# Patient Record
Sex: Female | Born: 1955 | Race: Asian | Hispanic: No | Marital: Married | Smoking: Never smoker
Health system: Southern US, Community
[De-identification: ages and names within clinical notes are randomized; demographics above are authoritative.]

## PROBLEM LIST (undated history)

## (undated) DIAGNOSIS — F419 Anxiety disorder, unspecified: Secondary | ICD-10-CM

## (undated) HISTORY — PX: JOINT REPLACEMENT: SHX530

## (undated) HISTORY — PX: CARPAL TUNNEL RELEASE: SHX101

## (undated) HISTORY — PX: APPENDECTOMY: SHX54

---

## 1998-03-17 ENCOUNTER — Ambulatory Visit (HOSPITAL_COMMUNITY): Admission: RE | Admit: 1998-03-17 | Discharge: 1998-03-17 | Payer: Self-pay | Admitting: *Deleted

## 2000-07-14 ENCOUNTER — Inpatient Hospital Stay (HOSPITAL_COMMUNITY): Admission: AD | Admit: 2000-07-14 | Discharge: 2000-07-14 | Payer: Self-pay | Admitting: Obstetrics and Gynecology

## 2000-07-14 ENCOUNTER — Encounter: Payer: Self-pay | Admitting: Emergency Medicine

## 2000-09-19 ENCOUNTER — Other Ambulatory Visit: Admission: RE | Admit: 2000-09-19 | Discharge: 2000-09-19 | Payer: Self-pay | Admitting: Obstetrics and Gynecology

## 2002-04-23 ENCOUNTER — Other Ambulatory Visit: Admission: RE | Admit: 2002-04-23 | Discharge: 2002-04-23 | Payer: Self-pay | Admitting: Obstetrics and Gynecology

## 2003-04-29 ENCOUNTER — Other Ambulatory Visit: Admission: RE | Admit: 2003-04-29 | Discharge: 2003-04-29 | Payer: Self-pay | Admitting: Obstetrics and Gynecology

## 2004-10-04 ENCOUNTER — Emergency Department (HOSPITAL_COMMUNITY): Admission: EM | Admit: 2004-10-04 | Discharge: 2004-10-04 | Payer: Self-pay

## 2004-10-23 ENCOUNTER — Other Ambulatory Visit: Admission: RE | Admit: 2004-10-23 | Discharge: 2004-10-23 | Payer: Self-pay | Admitting: Obstetrics and Gynecology

## 2010-11-28 ENCOUNTER — Emergency Department (HOSPITAL_BASED_OUTPATIENT_CLINIC_OR_DEPARTMENT_OTHER)
Admission: EM | Admit: 2010-11-28 | Discharge: 2010-11-29 | Disposition: A | Discharge status: Home / Self Care | Payer: BC Managed Care – PPO | Attending: Emergency Medicine | Admitting: Emergency Medicine

## 2010-11-28 ENCOUNTER — Encounter: Payer: Self-pay | Admitting: *Deleted

## 2010-11-28 DIAGNOSIS — R1013 Epigastric pain: Secondary | ICD-10-CM | POA: Insufficient documentation

## 2010-11-28 DIAGNOSIS — R079 Chest pain, unspecified: Secondary | ICD-10-CM | POA: Insufficient documentation

## 2010-11-28 DIAGNOSIS — Z79899 Other long term (current) drug therapy: Secondary | ICD-10-CM | POA: Insufficient documentation

## 2010-11-28 DIAGNOSIS — F410 Panic disorder [episodic paroxysmal anxiety] without agoraphobia: Secondary | ICD-10-CM | POA: Insufficient documentation

## 2010-11-28 HISTORY — DX: Anxiety disorder, unspecified: F41.9

## 2010-11-28 MED ORDER — GI COCKTAIL ~~LOC~~
30.0000 mL | Freq: Once | ORAL | Status: AC
  Administered 2010-11-29: 30 mL via ORAL
  Filled 2010-11-28: qty 30

## 2010-11-28 MED ORDER — ACETAMINOPHEN 325 MG PO TABS
650.0000 mg | ORAL_TABLET | Freq: Once | ORAL | Status: AC
  Administered 2010-11-29: 650 mg via ORAL
  Filled 2010-11-28: qty 2

## 2010-11-28 NOTE — ED Notes (Signed)
Pt is anxious and is hyperventilating slightly. Pt encouraged to slow breathing down.

## 2010-11-28 NOTE — ED Notes (Signed)
Pt states " panic attack" during presentation today. HX anxiety

## 2010-11-29 ENCOUNTER — Emergency Department (INDEPENDENT_AMBULATORY_CARE_PROVIDER_SITE_OTHER): Payer: BC Managed Care – PPO

## 2010-11-29 ENCOUNTER — Other Ambulatory Visit: Payer: Self-pay

## 2010-11-29 DIAGNOSIS — R0602 Shortness of breath: Secondary | ICD-10-CM

## 2010-11-29 DIAGNOSIS — F410 Panic disorder [episodic paroxysmal anxiety] without agoraphobia: Secondary | ICD-10-CM

## 2010-11-29 DIAGNOSIS — M4120 Other idiopathic scoliosis, site unspecified: Secondary | ICD-10-CM

## 2010-11-29 LAB — COMPREHENSIVE METABOLIC PANEL
AST: 23 U/L (ref 0–37)
Albumin: 4.3 g/dL (ref 3.5–5.2)
Alkaline Phosphatase: 69 U/L (ref 39–117)
BUN: 12 mg/dL (ref 6–23)
Creatinine, Ser: 0.6 mg/dL (ref 0.50–1.10)
Potassium: 3.3 mEq/L — ABNORMAL LOW (ref 3.5–5.1)
Total Protein: 8 g/dL (ref 6.0–8.3)

## 2010-11-29 LAB — TROPONIN I: Troponin I: <0.3 ng/mL (ref <0.30)

## 2010-11-29 LAB — LIPASE, BLOOD: Lipase: 76 U/L — ABNORMAL HIGH (ref 11–59)

## 2010-11-29 LAB — DIFFERENTIAL
Basophils Absolute: 0 10*3/uL (ref 0.0–0.1)
Basophils Relative: 0 % (ref 0–1)
Eosinophils Absolute: 0.1 10*3/uL (ref 0.0–0.7)
Monocytes Relative: 8 % (ref 3–12)
Neutrophils Relative %: 78 % — ABNORMAL HIGH (ref 43–77)

## 2010-11-29 LAB — CBC
Hemoglobin: 15.6 g/dL — ABNORMAL HIGH (ref 12.0–15.0)
MCH: 33.9 pg (ref 26.0–34.0)
MCHC: 35 g/dL (ref 30.0–36.0)
RDW: 11.4 % — ABNORMAL LOW (ref 11.5–15.5)

## 2010-11-29 LAB — CK TOTAL AND CKMB (NOT AT ARMC)
CK, MB: 3.2 ng/mL (ref 0.3–4.0)
CK, MB: 3.5 ng/mL (ref 0.3–4.0)
Relative Index: 2.2 (ref 0.0–2.5)
Total CK: 134 U/L (ref 7–177)
Total CK: 157 U/L (ref 7–177)

## 2010-11-29 LAB — TSH: TSH: 3.721 u[IU]/mL (ref 0.350–4.500)

## 2010-11-29 LAB — T4, FREE: Free T4: 1.16 ng/dL (ref 0.80–1.80)

## 2010-11-29 MED ORDER — IBUPROFEN 800 MG PO TABS
800.0000 mg | ORAL_TABLET | Freq: Once | ORAL | Status: AC
  Administered 2010-11-29: 800 mg via ORAL
  Filled 2010-11-29: qty 1

## 2010-11-29 NOTE — ED Provider Notes (Signed)
History    this patient presents 4 hours after the sudden onset of epigastric pain.  The patient notes that soon after the pain began she was nauseous, lightheaded, and her symptoms persisted for some time, using spontaneously. The pain was epigastric with minimal radiation superiorly about the sternum. No pleuritic pain, no exertional pain, no diarrhea. Notably, the patient does have a history of depression, anxiety. She notes "many" anxiety attacks, each of which manifests characteristically the same. These attacks are marked by palpitations, not pain, in the absence of nausea or headache. The patient has one similar episode of pain, which occurred almost exactly one year ago after similar circumstances (a stressful work situation). On MD eval, the patient only complains of headache.  CSN: [id number] Arrival date & time: 11/28/2010 10:37 PM  Chief Complaint  Patient presents with  . Panic Attack    HPI  (Consider location/radiation/quality/duration/timing/severity/associated sxs/prior treatment)  HPI  Past Medical History  Diagnosis Date  . Anxiety     Past Surgical History  Procedure Date  . Carpal tunnel release   . Joint replacement   . Appendectomy     History reviewed. No pertinent family history.  History  Substance Use Topics  . Smoking status: Never Smoker   . Smokeless tobacco: Not on file  . Alcohol Use: 0.6 oz/week    1 Glasses of wine per week    OB History    Grav Para Term Preterm Abortions TAB SAB Ect Mult Living                  Review of Systems  Review of Systems Gen: Per HPI HEENT: + HA CV: No CP Resp: No dyspnea Abd: Per HPI, otherwise negative Musk: Per HPI, otherwise negative Neuro: No dysesthesia, or focal changes GU: Per HPI, otherwise negative Skin: Neg Psych: Neg  Allergies  Review of patient's allergies indicates not on file.  Home Medications   Current Outpatient Rx  Name Route Sig Dispense Refill  . ALPRAZOLAM 0.5 MG PO  TABS Oral Take 0.5 mg by mouth at bedtime.      Marland Kitchen VITAMIN D 1000 UNITS PO TABS Oral Take 1,000 Units by mouth daily.      . OMEGA-3 FATTY ACIDS 1000 MG PO CAPS Oral Take 1 g by mouth daily.      Marland Kitchen FLUOXETINE HCL 20 MG PO CAPS Oral Take 60 mg by mouth daily.      Marland Kitchen OVER THE COUNTER MEDICATION Oral Take 10 mLs by mouth daily. DHEA     . ZOLPIDEM TARTRATE 10 MG PO TABS Oral Take 10 mg by mouth at bedtime.        Physical Exam    BP 150/100  Pulse 72  Temp(Src) 98.3 F (36.8 C) (Oral)  Resp 16  Ht 5\' 2"  (1.575 m)  Wt 130 lb (58.968 kg)  BMI 23.78 kg/m2  SpO2 100%  Physical Exam  Constitutional: She is oriented to person, place, and time. She appears well-developed and well-nourished.  HENT:  Head: Normocephalic and atraumatic.  Eyes: EOM are normal.  Cardiovascular: Normal rate and regular rhythm.   Pulmonary/Chest: Effort normal and breath sounds normal.  Abdominal: She exhibits no distension.  Musculoskeletal: She exhibits no edema and no tenderness.  Neurological: She is alert and oriented to person, place, and time.  Skin: Skin is warm and dry.    ED Course  Procedures (including critical care time)  Labs Reviewed  CBC - Abnormal; Notable for the following:  Hemoglobin 15.6 (*)    RDW 11.4 (*)    All other components within normal limits  DIFFERENTIAL - Abnormal; Notable for the following:    Neutrophils Relative 78 (*)    All other components within normal limits  TROPONIN I  TSH  CK TOTAL AND CKMB  COMPREHENSIVE METABOLIC PANEL  LIPASE, BLOOD  T4, FREE   No results found.   No diagnosis found.   MDM This 55 year old female with history of anxiety, and depression now presents with epigastric pain, nausea, headache. The symptoms are different from her prior anxiety attacks, and the patient has no history of prior cardiac evaluation. However the similarity with one prior event, the absence of distress, and the absence of current complaints his reassuring for  low probability of a CNS. The patient will be evaluated, and if she remains well him with a negative evaluation she'll be discharged to follow up with a cardiologist for further evaluation.  Labs Reviewed  CBC - Abnormal; Notable for the following:    Hemoglobin 15.6 (*)    RDW 11.4 (*)    All other components within normal limits  DIFFERENTIAL - Abnormal; Notable for the following:    Neutrophils Relative 78 (*)    All other components within normal limits  COMPREHENSIVE METABOLIC PANEL - Abnormal; Notable for the following:    Potassium 3.3 (*)    Glucose, Bld 118 (*)    All other components within normal limits  LIPASE, BLOOD - Abnormal; Notable for the following:    Lipase 76 (*)    All other components within normal limits  TROPONIN I  CK TOTAL AND CKMB  TROPONIN I  CK TOTAL AND CKMB  TSH  T4, FREE   CXR: neg for acute findings   Date: 11/29/2010  Rate: 58  Rhythm: sinus bradycardia  QRS Axis: normal  Intervals: QT prolonged  ST/T Wave abnormalities: normal  Conduction Disutrbances:none  Narrative Interpretation:   Old EKG Reviewed: none available  Patient has remained essentially well in the ED while awaiting a second set of cardiac enzymes. Repeat values were similarly reassuring/unremarkable. With the absence of evidence for acute cardiac ischemia, and the patient's history of anxiety, she will be discharged to follow up with her primary care physician.         Gerhard Munch, MD 11/29/10 [phone]

## 2010-12-04 ENCOUNTER — Ambulatory Visit
Admission: RE | Admit: 2010-12-04 | Discharge: 2010-12-04 | Disposition: A | Discharge status: Home / Self Care | Payer: BC Managed Care – PPO | Source: Ambulatory Visit | Attending: Family Medicine | Admitting: Family Medicine

## 2010-12-04 ENCOUNTER — Other Ambulatory Visit: Payer: Self-pay | Admitting: Family Medicine

## 2010-12-04 DIAGNOSIS — R51 Headache: Secondary | ICD-10-CM

## 2010-12-04 DIAGNOSIS — R1110 Vomiting, unspecified: Secondary | ICD-10-CM

## 2011-08-20 ENCOUNTER — Emergency Department (HOSPITAL_COMMUNITY): Payer: BC Managed Care – PPO

## 2011-08-20 ENCOUNTER — Encounter (HOSPITAL_COMMUNITY): Payer: Self-pay | Admitting: *Deleted

## 2011-08-20 ENCOUNTER — Emergency Department (HOSPITAL_COMMUNITY)
Admission: EM | Admit: 2011-08-20 | Discharge: 2011-08-20 | Disposition: A | Discharge status: Home / Self Care | Payer: BC Managed Care – PPO | Attending: Emergency Medicine | Admitting: Emergency Medicine

## 2011-08-20 DIAGNOSIS — W19XXXA Unspecified fall, initial encounter: Secondary | ICD-10-CM

## 2011-08-20 DIAGNOSIS — S060XAA Concussion with loss of consciousness status unknown, initial encounter: Secondary | ICD-10-CM | POA: Insufficient documentation

## 2011-08-20 DIAGNOSIS — R209 Unspecified disturbances of skin sensation: Secondary | ICD-10-CM | POA: Insufficient documentation

## 2011-08-20 DIAGNOSIS — W010XXA Fall on same level from slipping, tripping and stumbling without subsequent striking against object, initial encounter: Secondary | ICD-10-CM | POA: Insufficient documentation

## 2011-08-20 DIAGNOSIS — I609 Nontraumatic subarachnoid hemorrhage, unspecified: Secondary | ICD-10-CM | POA: Insufficient documentation

## 2011-08-20 DIAGNOSIS — M542 Cervicalgia: Secondary | ICD-10-CM | POA: Insufficient documentation

## 2011-08-20 DIAGNOSIS — S060X9A Concussion with loss of consciousness of unspecified duration, initial encounter: Secondary | ICD-10-CM | POA: Insufficient documentation

## 2011-08-20 LAB — PROTIME-INR: Prothrombin Time: 12.7 seconds (ref 11.6–15.2)

## 2011-08-20 LAB — COMPREHENSIVE METABOLIC PANEL
ALT: 13 U/L (ref 0–35)
Albumin: 3.7 g/dL (ref 3.5–5.2)
Alkaline Phosphatase: 62 U/L (ref 39–117)
Potassium: 3.6 mEq/L (ref 3.5–5.1)
Sodium: 140 mEq/L (ref 135–145)
Total Protein: 7.5 g/dL (ref 6.0–8.3)

## 2011-08-20 LAB — DIFFERENTIAL
Basophils Absolute: 0 10*3/uL (ref 0.0–0.1)
Basophils Relative: 0 % (ref 0–1)
Eosinophils Absolute: 0 10*3/uL (ref 0.0–0.7)
Neutrophils Relative %: 64 % (ref 43–77)

## 2011-08-20 LAB — CBC
MCH: 33.6 pg (ref 26.0–34.0)
MCHC: 33.9 g/dL (ref 30.0–36.0)
Platelets: 221 10*3/uL (ref 150–400)

## 2011-08-20 NOTE — ED Provider Notes (Signed)
I saw and evaluated the patient, reviewed the resident's note and I agree with the findings and plan. EKG reviewed  Pt reports she stumbled and fell while getting out of her car two days ago. Hit her head. Has had headache, nausea and dizziness since. CT shows SAH. Awaiting Neurosurg eval.   Amiere Cawley B. Bernette Mayers, MD 08/20/11 1635

## 2011-08-20 NOTE — ED Notes (Signed)
Pt states she fell on the left side and hit left head area.  Pt reports some neck pain

## 2011-08-20 NOTE — ED Provider Notes (Signed)
History     CSN: [id number]  Arrival date & time 08/20/11  1221   First MD Initiated Contact with Patient 08/20/11 1546      Chief Complaint  Patient presents with  . Headache  . Fall     Patient is a 56 y.o. female presenting with fall. The history is provided by the patient.  Fall The accident occurred 2 days ago. Incident: while getting out of vehicle. She fell from a height of 1 to 2 ft. She landed on concrete. There was no blood loss. The point of impact was the head (left temporal scalp). The pain is present in the head. The pain is moderate. She was ambulatory at the scene. There was no entrapment after the fall. There was no drug use involved in the accident. There was alcohol use involved in the accident. Associated symptoms include nausea, vomiting and headaches. Pertinent negatives include no fever. The symptoms are aggravated by standing and ambulation. Prehospitalization: None; pt thought symptoms were secondary to EtOH. She has tried rest for the symptoms.    Past Medical History  Diagnosis Date  . Anxiety     Past Surgical History  Procedure Date  . Carpal tunnel release   . Appendectomy   . Joint replacement     right elbow operation    No family history on file.  History  Substance Use Topics  . Smoking status: Never Smoker   . Smokeless tobacco: Not on file  . Alcohol Use: 0.6 oz/week    1 Glasses of wine per week    OB History    Grav Para Term Preterm Abortions TAB SAB Ect Mult Living                  Review of Systems  Constitutional: Negative for fever, chills, activity change and appetite change.  HENT: Negative for neck pain and neck stiffness.   Respiratory: Negative for cough, chest tightness, shortness of breath and wheezing.   Cardiovascular: Negative for chest pain and palpitations.  Gastrointestinal: Positive for nausea and vomiting.  Genitourinary: Negative for dysuria, decreased urine volume and difficulty urinating.    Musculoskeletal: Negative for myalgias, back pain, joint swelling, arthralgias and gait problem.  Skin: Negative for rash and wound.  Neurological: Positive for dizziness, weakness, light-headedness and headaches. Negative for seizures, syncope and facial asymmetry.  Psychiatric/Behavioral: Negative for suicidal ideas, behavioral problems, confusion, self-injury, decreased concentration and agitation.  All other systems reviewed and are negative.    Allergies  Review of patient's allergies indicates no known allergies.  Home Medications   Current Outpatient Rx  Name Route Sig Dispense Refill  . ALPRAZOLAM 0.5 MG PO TABS Oral Take 0.5 mg by mouth at bedtime.      Marland Kitchen VITAMIN D 1000 UNITS PO TABS Oral Take 1,000 Units by mouth daily.      . OMEGA-3 FATTY ACIDS 1000 MG PO CAPS Oral Take 1 g by mouth daily.      Marland Kitchen FLUOXETINE HCL 20 MG PO CAPS Oral Take 60 mg by mouth daily.      Marland Kitchen OVER THE COUNTER MEDICATION Oral Take 10 mLs by mouth daily. DHEA     . ZOLPIDEM TARTRATE 10 MG PO TABS Oral Take 10 mg by mouth at bedtime.        BP 140/95  Pulse 69  Temp 98.4 F (36.9 C) (Oral)  Resp 13  SpO2 97%  Physical Exam  Nursing note and vitals reviewed. Constitutional: She is  oriented to person, place, and time. She appears well-developed and well-nourished.  HENT:  Head: Normocephalic and atraumatic.  Right Ear: External ear normal.  Left Ear: External ear normal.  Nose: Nose normal.  Mouth/Throat: Oropharynx is clear and moist. No oropharyngeal exudate.  Eyes: Conjunctivae are normal. Pupils are equal, round, and reactive to light.  Neck: Normal range of motion. Neck supple.  Cardiovascular: Normal rate, regular rhythm, normal heart sounds and intact distal pulses.  Exam reveals no gallop and no friction rub.   No murmur heard. Pulmonary/Chest: Effort normal and breath sounds normal. No respiratory distress. She has no wheezes. She has no rales. She exhibits no tenderness.  Abdominal:  Soft. Bowel sounds are normal. She exhibits no distension and no mass. There is no tenderness. There is no rebound and no guarding.  Musculoskeletal: Normal range of motion. She exhibits no edema and no tenderness.  Neurological: She is alert and oriented to person, place, and time. She displays normal reflexes. A cranial nerve deficit (left facial numbness) is present. She exhibits normal muscle tone. Coordination normal.  Skin: Skin is warm and dry.  Psychiatric: She has a normal mood and affect. Her behavior is normal. Judgment and thought content normal.    ED Course  Procedures (including critical care time)  Labs Reviewed  COMPREHENSIVE METABOLIC PANEL - Abnormal; Notable for the following:    Glucose, Bld 109 (*)     All other components within normal limits  CBC  DIFFERENTIAL  PROTIME-INR   Ct Head Wo Contrast  08/20/2011  *RADIOLOGY REPORT*  Clinical Data:  Status post fall 2 days ago with loss of consciousness.  CT HEAD WITHOUT CONTRAST CT CERVICAL SPINE WITHOUT CONTRAST  Technique:  Multidetector CT imaging of the head and cervical spine was performed following the standard protocol without intravenous contrast.  Multiplanar CT image reconstructions of the cervical spine were also generated.  Comparison:   None  CT HEAD  Findings: There is subarachnoid hemorrhage over the left frontal and temporal convexities with small amount of blood seen in the Sylvian fissure.  The brain is mildly atrophic with chronic microvascular ischemic change.  No evidence of acute infarction, midline shift or hydrocephalus.  No fracture is identified.  No pneumocephalus.  IMPRESSION:  1.  Subarachnoid hemorrhage over the left frontal temporal convexities. 2.  Atrophy and chronic microvascular ischemic change. 3. Critical Value/emergent results were called by telephone at the time of interpretation on 08/20/2011  at 3:40 p.m.  to  Dr. Preston Fleeting, who verbally acknowledged these results.  CT CERVICAL SPINE   Findings: There is no fracture or subluxation of the cervical spine.  Loss of disc space height and endplate spurring are seen at C6-7.  Imaged lung parenchyma is clear.  IMPRESSION: No acute finding.  Original Report Authenticated By: Bernadene Bell. Maricela Curet, M.D.   Ct Cervical Spine Wo Contrast  08/20/2011  *RADIOLOGY REPORT*  Clinical Data:  Status post fall 2 days ago with loss of consciousness.  CT HEAD WITHOUT CONTRAST CT CERVICAL SPINE WITHOUT CONTRAST  Technique:  Multidetector CT imaging of the head and cervical spine was performed following the standard protocol without intravenous contrast.  Multiplanar CT image reconstructions of the cervical spine were also generated.  Comparison:   None  CT HEAD  Findings: There is subarachnoid hemorrhage over the left frontal and temporal convexities with small amount of blood seen in the Sylvian fissure.  The brain is mildly atrophic with chronic microvascular ischemic change.  No evidence of  acute infarction, midline shift or hydrocephalus.  No fracture is identified.  No pneumocephalus.  IMPRESSION:  1.  Subarachnoid hemorrhage over the left frontal temporal convexities. 2.  Atrophy and chronic microvascular ischemic change. 3. Critical Value/emergent results were called by telephone at the time of interpretation on 08/20/2011  at 3:40 p.m.  to  Dr. Preston Fleeting, who verbally acknowledged these results.  CT CERVICAL SPINE  Findings: There is no fracture or subluxation of the cervical spine.  Loss of disc space height and endplate spurring are seen at C6-7.  Imaged lung parenchyma is clear.  IMPRESSION: No acute finding.  Original Report Authenticated By: Bernadene Bell. D'ALESSIO, M.D.     1. Subarachnoid hemorrhage   2. Fall     Date: 08/20/2011  Rate: 66 bpm  Rhythm: normal sinus rhythm  QRS Axis: normal  Intervals: normal  ST/T Wave abnormalities: normal  Conduction Disutrbances:none  Narrative Interpretation: No evidence of acute ischemia or arrhythmia   Old  EKG Reviewed: changes noted; not bradycardic as in 11/29/10 EKG    MDM  56 yo F presents 48 hrs s/p fall onto left scalp. Pt has had headache, light-headedness, and emesis x 5 since event. Imaging c/w left fronto-temporal SAH. No focal neuro deficits. Neurosurgery evaluated pt in ED. Because of duration since fall and pt's clinical picture now, patient can be safely discharged home with NSU follow-up. Patient given return precautions, including worsening headache, weakness/numbness, difficulty speaking, difficulty walking or any other concerning signs or symptoms. Patient instructed to follow-up with primary care physician.           Clemetine Marker, MD 08/20/11 2314

## 2011-08-20 NOTE — ED Notes (Signed)
Patient undressed and in a gown. Cardiac monitor, pulse oximetry, and blood pressure cuff on. 

## 2011-08-20 NOTE — ED Notes (Signed)
Resident Noe Gens at bedside.

## 2011-08-20 NOTE — Discharge Instructions (Signed)
Subarachnoid Hemorrhage Subarachnoid hemorrhage is bleeding between the middle membrane (arachnoid mater) covering of the brain and the brain itself. The disorder may cause permanent brain damage. It is most common in people 34 to 55 years old. It is slightly more common in women than men.  CAUSES   Injury.   Ruptured cerebral aneurysm.   Bleeding from an arteriovenous malformation.   Bleeding disorder.   Use of blood thinners (anticoagulants).   Unknown cause.  RISK FACTORS  High blood pressure (hypertension).   Smoking.  SYMPTOMS   Sudden headache which is usually described as the "worst headache ever experienced." The headache can be preceded by a popping or snapping sensation in the head. The pain is often worse at the back of the head.   Sudden difficulty or loss of movement or sensation of a part of the body.   Sudden nausea and vomiting.   Sudden vision problems.   Light bothering or hurting the eyes (photophobia).   Changes in mood and personality.   Irritability.   One black center of the eye (pupil) is larger than the other.   Seizures.   Decreased consciousness and alertness.   Muscle aches (especially in the neck and shoulders).   Stiff neck.   Eyelid drooping.   Confusion.  DIAGNOSIS   Your caregiver will likely know what is wrong after doing an exam.   Specialized X-rays may show blood in the subarachnoid space.   A lumbar puncture may show blood in the spinal fluid.  TREATMENT  Treatment will depend on what the problems are, where they are located, the extent of the bleeding and damage and what is available to help. Subarachnoid hemorrhage has an outcome which depends on those factors. Complete recovery can occur after treatment, but death may occur in some cases with or without treatment. Goals include:  Lifesaving measures.   Repair of the cause of the bleeding.   Relief of symptoms.   Prevention of complications.  Treatment is  usually required. Sometimes this treatment may just require observation to see if the bleeding is going to stop. If treatment is required it may be done by opening a hole in the skull (craniotomy) and clipping the aneurysm (placing a metal clip across the base of the aneurysm so it cannot bleed anymore). Also the vessel can be catheterized (a long thin tube is run into the vessel) and an endovascular coiling (platinum coils) can be placed inside the aneurysm. This is done from inside the blood vessel. Removal of pockets of blood may also be necessary. HOME CARE INSTRUCTIONS After your hospitalization or inpatient rehabilitation is completed and you are well enough to go home, it is important to prevent a reoccurence. It is very important to make lifestyle changes and avoid head injuries.   Do not  smoke. Talk to your caregiver on how to quit smoking.   Make other lifestyle changes as directed by your caregiver.   Monitor and record your blood pressure as directed by your caregiver.   It is important to take all medicine as directed by your caregiver. If the medicine has side effects that affect you negatively, tell your caregiver right away. Do not stop taking medicine unless told by your caregiver. Some medicines may need to be changed to better treat your condition.   Follow all instructions for follow up as directed by your caregiver. This includes any referrals, physical therapy, and outpatient rehabilitation. Any delay in obtaining necessary care could result in a  delay or failure of the injury to heal properly. Also, failure to follow up may result in permanent neurologic deficit, seizures, strokes, or even death.  SEEK IMMEDIATE MEDICAL CARE IF:   You develop a severe headache.   You feel faint or pass out.   You develop symptoms similar to those that developed with a previous subarachnoid hemorrhage.   You develop a painful or stiff neck.   You have problems with your vision.  ANY OF  THESE SYMPTOMS MAY REPRESENT A SERIOUS PROBLEM THAT IS AN EMERGENCY. Do not wait to see if the symptoms will go away. Get medical help right away. Call your local emergency services (911 in [place] DO NOT drive yourself to the hospital. Document Released: 01/07/2004 Document Revised: 02/08/2011 Document Reviewed: 10/06/2007 [facility] Patient Information 2012 [place], [place].  Traumatic Brain Injury  Traumatic brain injury (TBI) occurs when an injury to the head causes the brain to move back and forth. The risk of brain injury varies with the severity of the trauma. The damage can be confined to one area of the brain (focal) or involve different areas of the brain (diffuse). The severity of a brain injury can range from:  A blow or jolt to the head that disrupts the normal function of the brain (concussion).  A deep state of unconsciousness (coma).  Death.  CAUSES  A brain injury can result from:  A closed head injury. This occurs when the head suddenly and violently hits an object but the object does not break through the skull. Examples include:  A direct blow (hitting your head on a hard surface).  An indirect blow (when your head moves rapidly and violently back and forth like in a car crash). This injury is called countrecoup (involving a blow and counter blow). Shaken baby syndrome is a severe type of this injury. It happens when a baby is shaken forcibly enough to cause extreme countrecoup injury.  Penetrating head injury. A penetrating head injury occurs when an object pierces the skull and enters the brain tissue. Examples include:  A skull fracture occurs when the skull cracks or breaks.  A depressed skull fracture occurs when pieces of the broken skull press into the tissue of the brain. This can cause bruising of the brain tissue called a contusion.  In both closed and penetrating head injuries, damage to blood vessels can cause heavy bleeding into or around the brain.  SYMPTOMS  The  symptoms of a TBI depend on the type and severity of the injury. The most common symptoms include:  Confusion (disorientation) or other thinking problems.  An inability to remember events around the time of the injury (amnesia).  Difficulty staying awake or passing out (loss of consciousness).  Headache.  Blurry vision.  Vomiting.  Seizures.  Swelling of the scalp. This occurs because of bleeding or swelling under the skin of the skull when the head is hit.  TREATMENT  Treatment of traumatic brain injury can involve a range of different medical options:  If a brain injury is moderate to severe, a hospital stay will be necessary to monitor:  Neurological status.  Pressure or swelling of the brain (intracranial pressure).  For seizures.  Severe brain injury cases may need surgery to:  Control bleeding.  Relieve pressure on the brain.  Remove objects from the brain that result from a penetrating injury.  Repair the skull from an injury.  Long term treatment of a brain injury can involve rehabilitation work such as:  Physical therapy.  Occupational therapy.  Speech therapy.  PROGNOSIS  The outcome of TBI depends on the cause of the injury, location, severity, and extent of neurological damage. Outcomes range from good recovery to death. Long term consequences of a TBI can include:  Difficulty concentrating or having a short attention span.  Change in personality.  Irritability.  Headaches.  Blurry vision.  Sleepiness.  Depression.  Unsteadiness that makes walking or standing hard to do.  For more information and support, contact: [employer] of Neu[employer] and Stroke.  Document Released: 02/09/2002 Document Revised: 02/08/2011 Document Reviewed: 02/17/2009  [employer] Patient Information 2012 [employer], [place].  AVOID ANYTHING THAT CAN MAKE YOUR BLOOD THINNER (SUCH AS ASPIRIN, IBUPROFEN, AND OTHER NON-STEROIDAL ANTI-INFLAMMATORIES)

## 2011-08-20 NOTE — Consult Note (Signed)
Reason for Consult: Closed head injury Referring Physician: ER Dr. Clemetine Marker  Tammy Gay is an 56 y.o. female.  HPI: Patient is a 56 year old female who was out to dinner Saturday evening and had several glasses align and was of intoxicated which came home and apparently fell while getting out of a car striking the vertex and left side of her head to throughout that evening she experienced headache some nausea vomiting or multiple episodes they did persist until last night intermittently to her today she's not had any episodes of vomiting. She does tear headache is about the same today's was yesterday's but she has not experienced any visual changes any numbness tingling her face arms or legs. She denies any other significant past medical history  Past Medical History  Diagnosis Date  . Anxiety     Past Surgical History  Procedure Date  . Carpal tunnel release   . Appendectomy   . Joint replacement     right elbow operation    No family history on file.  Social History:  reports that she has never smoked. She does not have any smokeless tobacco history on file. She reports that she drinks about .6 ounces of alcohol per week. She reports that she does not use illicit drugs.  Allergies: No Known Allergies  Medications: I have reviewed the patient's current medications.  Results for orders placed during the hospital encounter of 08/20/11 (from the past 48 hour(s))  CBC     Status: Normal   Collection Time   08/20/11  4:05 PM      Component Value Range Comment   WBC 5.6  4.0 - 10.5 K/uL    RBC 4.43  3.87 - 5.11 MIL/uL    Hemoglobin 14.9  12.0 - 15.0 g/dL    HCT 47.8  29.5 - 62.1 %    MCV 99.3  78.0 - 100.0 fL    MCH 33.6  26.0 - 34.0 pg    MCHC 33.9  30.0 - 36.0 g/dL    RDW 30.8  65.7 - 84.6 %    Platelets 221  150 - 400 K/uL   DIFFERENTIAL     Status: Normal   Collection Time   08/20/11  4:05 PM      Component Value Range Comment   Neutrophils Relative 64  43 - 77 %    Neutro Abs 3.5  1.7 - 7.7 K/uL    Lymphocytes Relative 28  12 - 46 %    Lymphs Abs 1.6  0.7 - 4.0 K/uL    Monocytes Relative 7  3 - 12 %    Monocytes Absolute 0.4  0.1 - 1.0 K/uL    Eosinophils Relative 1  0 - 5 %    Eosinophils Absolute 0.0  0.0 - 0.7 K/uL    Basophils Relative 0  0 - 1 %    Basophils Absolute 0.0  0.0 - 0.1 K/uL   COMPREHENSIVE METABOLIC PANEL     Status: Abnormal   Collection Time   08/20/11  4:05 PM      Component Value Range Comment   Sodium 140  135 - 145 mEq/L    Potassium 3.6  3.5 - 5.1 mEq/L    Chloride 103  96 - 112 mEq/L    CO2 26  19 - 32 mEq/L    Glucose, Bld 109 (*) 70 - 99 mg/dL    BUN 13  6 - 23 mg/dL    Creatinine, Ser 9.62  0.50 -  1.10 mg/dL    Calcium 9.3  8.4 - 65.7 mg/dL    Total Protein 7.5  6.0 - 8.3 g/dL    Albumin 3.7  3.5 - 5.2 g/dL    AST 18  0 - 37 U/L    ALT 13  0 - 35 U/L    Alkaline Phosphatase 62  39 - 117 U/L    Total Bilirubin 0.8  0.3 - 1.2 mg/dL    GFR calc non Af Amer >90  >90 mL/min    GFR calc Af Amer >90  >90 mL/min   PROTIME-INR     Status: Normal   Collection Time   08/20/11  4:05 PM      Component Value Range Comment   Prothrombin Time 12.7  11.6 - 15.2 seconds    INR 0.93  0.00 - 1.49     Ct Head Wo Contrast  08/20/2011  *RADIOLOGY REPORT*  Clinical Data:  Status post fall 2 days ago with loss of consciousness.  CT HEAD WITHOUT CONTRAST CT CERVICAL SPINE WITHOUT CONTRAST  Technique:  Multidetector CT imaging of the head and cervical spine was performed following the standard protocol without intravenous contrast.  Multiplanar CT image reconstructions of the cervical spine were also generated.  Comparison:   None  CT HEAD  Findings: There is subarachnoid hemorrhage over the left frontal and temporal convexities with small amount of blood seen in the Sylvian fissure.  The brain is mildly atrophic with chronic microvascular ischemic change.  No evidence of acute infarction, midline shift or hydrocephalus.  No fracture is  identified.  No pneumocephalus.  IMPRESSION:  1.  Subarachnoid hemorrhage over the left frontal temporal convexities. 2.  Atrophy and chronic microvascular ischemic change. 3. Critical Value/emergent results were called by telephone at the time of interpretation on 08/20/2011  at 3:40 p.m.  to  Dr. Preston Fleeting, who verbally acknowledged these results.  CT CERVICAL SPINE  Findings: There is no fracture or subluxation of the cervical spine.  Loss of disc space height and endplate spurring are seen at C6-7.  Imaged lung parenchyma is clear.  IMPRESSION: No acute finding.  Original Report Authenticated By: Bernadene Bell. Maricela Curet, M.D.   Ct Cervical Spine Wo Contrast  08/20/2011  *RADIOLOGY REPORT*  Clinical Data:  Status post fall 2 days ago with loss of consciousness.  CT HEAD WITHOUT CONTRAST CT CERVICAL SPINE WITHOUT CONTRAST  Technique:  Multidetector CT imaging of the head and cervical spine was performed following the standard protocol without intravenous contrast.  Multiplanar CT image reconstructions of the cervical spine were also generated.  Comparison:   None  CT HEAD  Findings: There is subarachnoid hemorrhage over the left frontal and temporal convexities with small amount of blood seen in the Sylvian fissure.  The brain is mildly atrophic with chronic microvascular ischemic change.  No evidence of acute infarction, midline shift or hydrocephalus.  No fracture is identified.  No pneumocephalus.  IMPRESSION:  1.  Subarachnoid hemorrhage over the left frontal temporal convexities. 2.  Atrophy and chronic microvascular ischemic change. 3. Critical Value/emergent results were called by telephone at the time of interpretation on 08/20/2011  at 3:40 p.m.  to  Dr. Preston Fleeting, who verbally acknowledged these results.  CT CERVICAL SPINE  Findings: There is no fracture or subluxation of the cervical spine.  Loss of disc space height and endplate spurring are seen at C6-7.  Imaged lung parenchyma is clear.  IMPRESSION: No acute  finding.  Original Report Authenticated By:  THOMAS L. D'ALESSIO, M.D.    @ROS @ Blood pressure 140/95, pulse 69, temperature 98.4 F (36.9 C), temperature source Oral, resp. rate 13, SpO2 97.00%. Patient is awake alert oriented  pupils are equal round and reactive to light, extraocular movements are intact strength is 5 out of 5 in her upper and lower 70s with no evidence of pronator drift.  Assessment/Plan: 56 yo female sustained a closed injury when falling out of a car on Saturday she is now 48 hours after the event with a head CT that shows a small amount of traumatic subarachnoid hemorrhage over her left frontoparietal temporal area a small amount of sylvian fissure there is no mass effect this is not consistent with a spontaneous subarachnoid hemorrhage. This is very consistent with traumatic especially with her history. CT scan is now 48 hours after the event. I believe starting at her observation. She does appear to be improving or vomiting has settled down. Instructed the ER to slowly increase her mobilization make sure she can tolerate by mouth and then she should feel be discharged I was scheduled followup approximately one week at which point we might repeat CT. CRNA give her a concussion head injury sheet and was to watch for as well as are reviewed all these instructions with the patient myself.  Anny Sayler P 08/20/2011, 5:19 PM

## 2011-08-20 NOTE — ED Notes (Signed)
Larey Seat last Saturday nite and then vomited after.  PT sts lay down all day yesterday.  PT reports dizziness.  Pt states increased pain to left side of head.

## 2011-08-28 ENCOUNTER — Other Ambulatory Visit: Payer: Self-pay | Admitting: Neurosurgery

## 2011-08-28 ENCOUNTER — Ambulatory Visit
Admission: RE | Admit: 2011-08-28 | Discharge: 2011-08-28 | Disposition: A | Discharge status: Home / Self Care | Payer: BC Managed Care – PPO | Source: Ambulatory Visit | Attending: Neurosurgery | Admitting: Neurosurgery

## 2011-08-28 DIAGNOSIS — S060X9A Concussion with loss of consciousness of unspecified duration, initial encounter: Secondary | ICD-10-CM

## 2018-01-01 DIAGNOSIS — F5104 Psychophysiologic insomnia: Secondary | ICD-10-CM | POA: Insufficient documentation

## 2018-12-12 ENCOUNTER — Other Ambulatory Visit: Payer: Self-pay

## 2018-12-12 DIAGNOSIS — Z20822 Contact with and (suspected) exposure to covid-19: Secondary | ICD-10-CM

## 2018-12-13 LAB — NOVEL CORONAVIRUS, NAA: SARS-CoV-2, NAA: NOT DETECTED

## 2019-02-24 ENCOUNTER — Ambulatory Visit: Payer: Managed Care, Other (non HMO) | Attending: Internal Medicine

## 2019-02-24 DIAGNOSIS — Z20822 Contact with and (suspected) exposure to covid-19: Secondary | ICD-10-CM

## 2019-02-26 LAB — NOVEL CORONAVIRUS, NAA: SARS-CoV-2, NAA: NOT DETECTED

## 2019-03-24 ENCOUNTER — Ambulatory Visit: Payer: Managed Care, Other (non HMO) | Attending: Internal Medicine

## 2019-03-24 DIAGNOSIS — Z20822 Contact with and (suspected) exposure to covid-19: Secondary | ICD-10-CM

## 2019-03-26 LAB — NOVEL CORONAVIRUS, NAA: SARS-CoV-2, NAA: NOT DETECTED

## 2019-05-21 ENCOUNTER — Ambulatory Visit: Payer: Managed Care, Other (non HMO)

## 2019-05-28 ENCOUNTER — Ambulatory Visit: Payer: Managed Care, Other (non HMO) | Attending: Internal Medicine

## 2019-05-28 DIAGNOSIS — Z20822 Contact with and (suspected) exposure to covid-19: Secondary | ICD-10-CM

## 2019-05-29 LAB — NOVEL CORONAVIRUS, NAA: SARS-CoV-2, NAA: NOT DETECTED

## 2019-05-29 LAB — SARS-COV-2, NAA 2 DAY TAT

## 2019-06-08 ENCOUNTER — Ambulatory Visit: Payer: HRSA Program | Attending: Internal Medicine

## 2019-06-08 DIAGNOSIS — Z20822 Contact with and (suspected) exposure to covid-19: Secondary | ICD-10-CM | POA: Insufficient documentation

## 2019-06-10 LAB — NOVEL CORONAVIRUS, NAA: SARS-CoV-2, NAA: NOT DETECTED

## 2019-06-10 LAB — SARS-COV-2, NAA 2 DAY TAT

## 2019-06-15 ENCOUNTER — Other Ambulatory Visit: Payer: Self-pay

## 2019-06-15 DIAGNOSIS — R7303 Prediabetes: Secondary | ICD-10-CM | POA: Insufficient documentation

## 2020-06-22 DIAGNOSIS — Z860100 Personal history of colon polyps, unspecified: Secondary | ICD-10-CM | POA: Insufficient documentation

## 2020-09-09 DIAGNOSIS — E559 Vitamin D deficiency, unspecified: Secondary | ICD-10-CM | POA: Insufficient documentation

## 2021-06-26 ENCOUNTER — Other Ambulatory Visit: Payer: Self-pay | Admitting: Family Medicine

## 2021-06-26 DIAGNOSIS — N644 Mastodynia: Secondary | ICD-10-CM

## 2021-07-07 ENCOUNTER — Other Ambulatory Visit: Payer: Self-pay

## 2021-07-17 ENCOUNTER — Ambulatory Visit
Admission: RE | Admit: 2021-07-17 | Discharge: 2021-07-17 | Disposition: A | Discharge status: Home / Self Care | Payer: Medicare Other | Source: Ambulatory Visit | Attending: Family Medicine | Admitting: Family Medicine

## 2021-07-17 DIAGNOSIS — N644 Mastodynia: Secondary | ICD-10-CM

## 2021-11-07 ENCOUNTER — Other Ambulatory Visit: Payer: Self-pay | Admitting: Family Medicine

## 2021-11-07 DIAGNOSIS — R1310 Dysphagia, unspecified: Secondary | ICD-10-CM

## 2021-11-21 ENCOUNTER — Ambulatory Visit
Admission: RE | Admit: 2021-11-21 | Discharge: 2021-11-21 | Disposition: A | Discharge status: Home / Self Care | Payer: Medicare Other | Source: Ambulatory Visit | Attending: Family Medicine | Admitting: Family Medicine

## 2021-11-21 DIAGNOSIS — R1310 Dysphagia, unspecified: Secondary | ICD-10-CM | POA: Insufficient documentation

## 2021-11-21 NOTE — Therapy (Signed)
[place], [place], [address] Phone: [phone]   Fax:     Modified Barium Swallow  Patient Details  Name: Tammy Gay MRN: [id number] Date of Birth: [birth year] No data recorded  Encounter Date: 11/21/2021   End of Session - 11/21/21 1655     Visit Number 1    Number of Visits 1    Date for SLP Re-Evaluation 11/21/21    SLP Start Time 1300    SLP Stop Time  1400    SLP Time Calculation (min) 60 min    Activity Tolerance Patient tolerated treatment well             Past Medical History:  Diagnosis Date   Anxiety     Past Surgical History:  Procedure Laterality Date   APPENDECTOMY     CARPAL TUNNEL RELEASE     JOINT REPLACEMENT     right elbow operation    There were no vitals filed for this visit.  Subjective: Patient behavior: (alertness, ability to follow instructions, etc.): Chief complaint   Objective:  Radiological Procedure: A videoflouroscopic evaluation of oral-preparatory, reflex initiation, and pharyngeal phases of the swallow was performed; as well as a screening of the upper esophageal phase.  POSTURE: VIEW: COMPENSATORY STRATEGIES: BOLUSES ADMINISTERED:  Thin Liquid:  Nectar-thick Liquid:  Honey-thick Liquid:  Puree:  Mechanical Soft: RESULTS OF EVALUATION: ORAL PREPARATORY PHASE: (The lips, tongue, and velum are observed for strength and coordination)       **Overall Severity Rating: WFL.   SWALLOW INITIATION/REFLEX: (The reflex is normal if "triggered" by the time the bolus reached the base of the tongue)  **Overall Severity Rating: WFL-Mild.  PHARYNGEAL PHASE: (Pharyngeal function is normal if the bolus shows rapid, smooth, and continuous transit through the pharynx and there is no pharyngeal residue after the swallow)  **Overall Severity Rating: [facility].  LARYNGEAL PENETRATION: (Material entering into the laryngeal inlet/vestibule but not aspirated):  NONE ASPIRATION: NONE. ESOPHAGEAL PHASE: (Screening of the upper esophagus): min presentation of increased air swallowed w/ air trapping in lower cervical esophagus(viewable). This cleared w/ time b/t trials.    ASSESSMENT:   PLAN/RECOMMENDATIONS:  A. Diet: Regular diet w/ thin liquids; moistened foods. Monitor/avoid problematic foods if needed.   B. Swallowing Precautions: general aspiration precautions; REFLUX precautions  C. Recommended consultation to: ENT; GI if needed to address any s/s or r/o of Reflux and/or need for PPI.  D. Therapy recommendations: None.  E. Results and recommendations were discussed w/ patient, video viewed and questions answered. Pt stated agreement w/ no further questions.     Dysphagia, unspecified type - Plan: DG SWALLOW FUNC OP MEDICARE SPEECH PATH, DG SWALLOW FUNC OP MEDICARE SPEECH PATH        Problem List There are no problems to display for this patient.      Orinda Kenner, [place], [place] [facility]; [place] [phone] ([address] [place], [place] 11/21/2021, 4:56 PM  [facility] [place], [place], [address] Phone: [phone]   Fax:     Name: Caleigh Rabelo MRN: [id number] Date of Birth: 1955-04-13

## 2022-01-26 ENCOUNTER — Encounter: Payer: Self-pay | Admitting: Surgery

## 2022-08-02 ENCOUNTER — Ambulatory Visit: Payer: Medicare Other

## 2022-08-02 DIAGNOSIS — K2970 Gastritis, unspecified, without bleeding: Secondary | ICD-10-CM

## 2022-08-02 DIAGNOSIS — K2100 Gastro-esophageal reflux disease with esophagitis, without bleeding: Secondary | ICD-10-CM | POA: Diagnosis not present

## 2022-10-02 DIAGNOSIS — E7800 Pure hypercholesterolemia, unspecified: Secondary | ICD-10-CM | POA: Insufficient documentation

## 2022-10-13 DIAGNOSIS — M85852 Other specified disorders of bone density and structure, left thigh: Secondary | ICD-10-CM | POA: Insufficient documentation

## 2023-02-03 IMAGING — MG DIGITAL DIAGNOSTIC BILAT W/ TOMO W/ CAD
8 series · 8 of 24 positions shown · non-contrast
Comparison: Previous exam(s).

CLINICAL DATA: Patient states that she had diffuse bilateral breast
pain that has resolved. She has no current complaints.

EXAM:
DIGITAL DIAGNOSTIC BILATERAL MAMMOGRAM WITH TOMOSYNTHESIS AND CAD
TECHNIQUE: Bilateral digital diagnostic mammography and breast tomosynthesis
was performed. The images were evaluated with computer-aided
detection.

[R CC synth-2D]
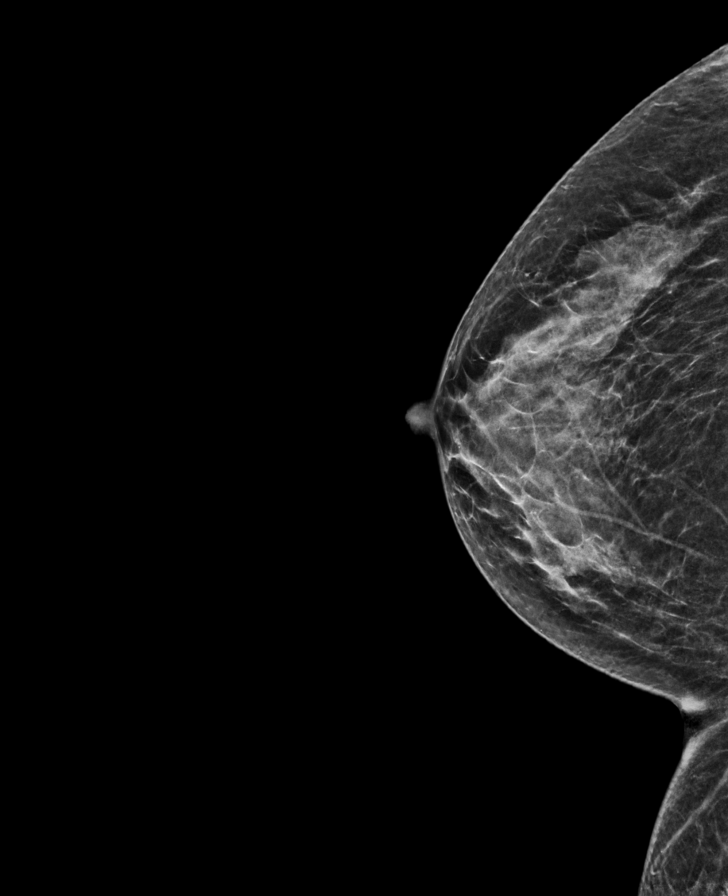

[R MLO synth-2D]
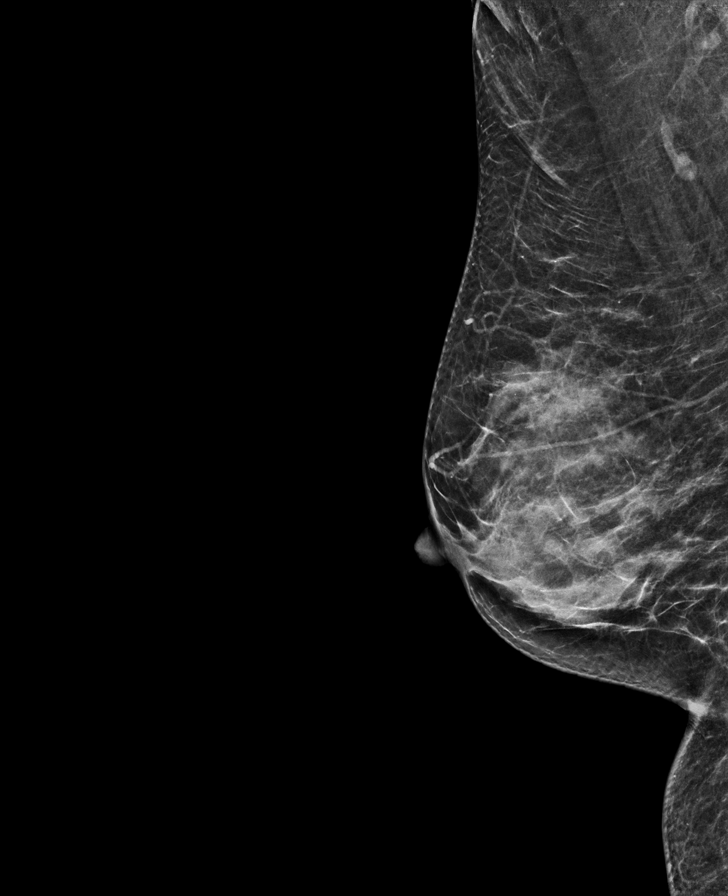

[L CC synth-2D]
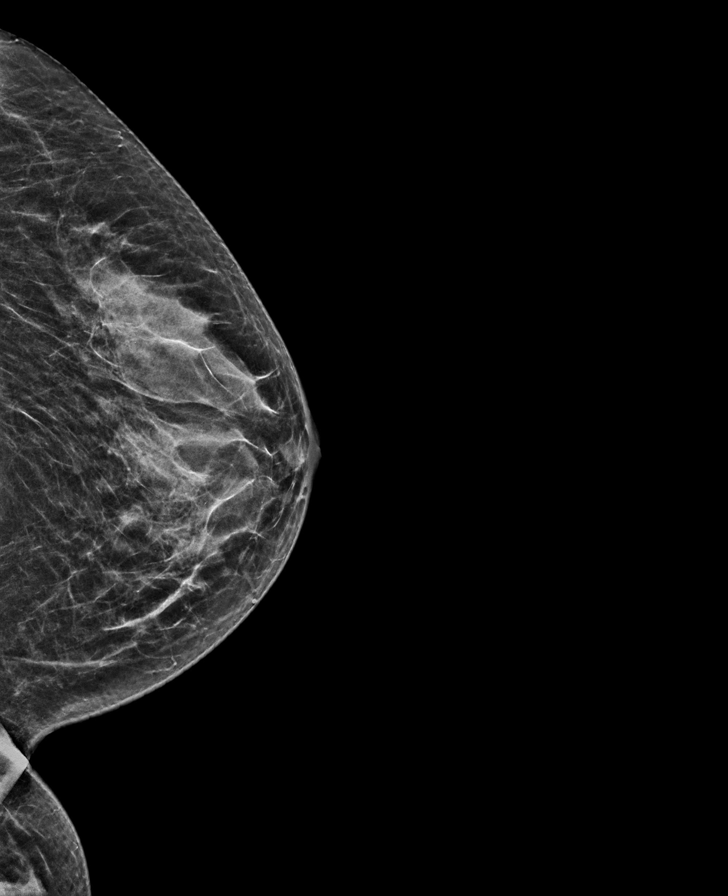

[L MLO synth-2D]
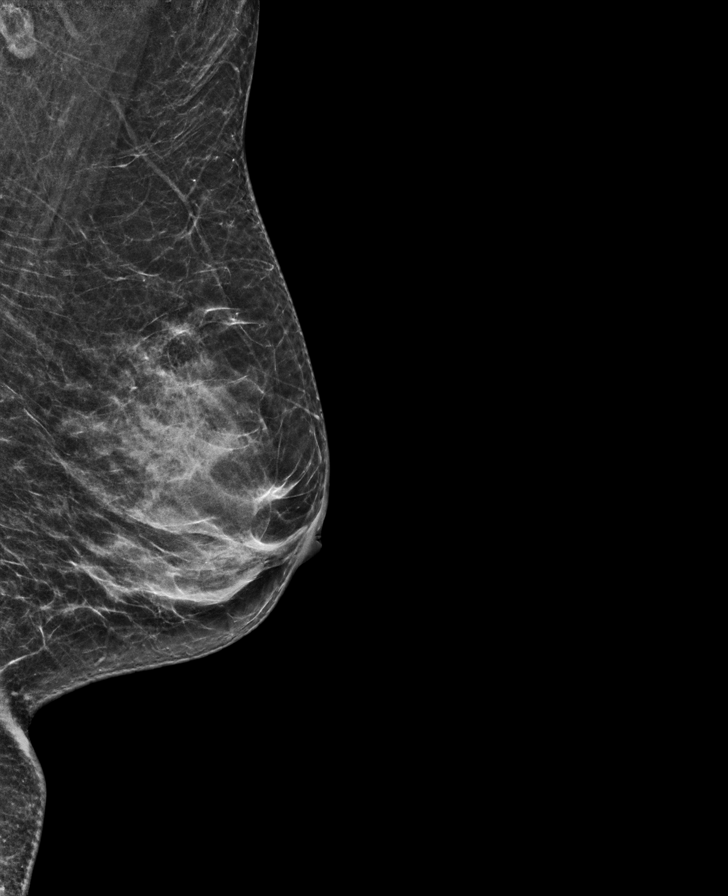

[R CC tomo · tomo slice 29/57.0]
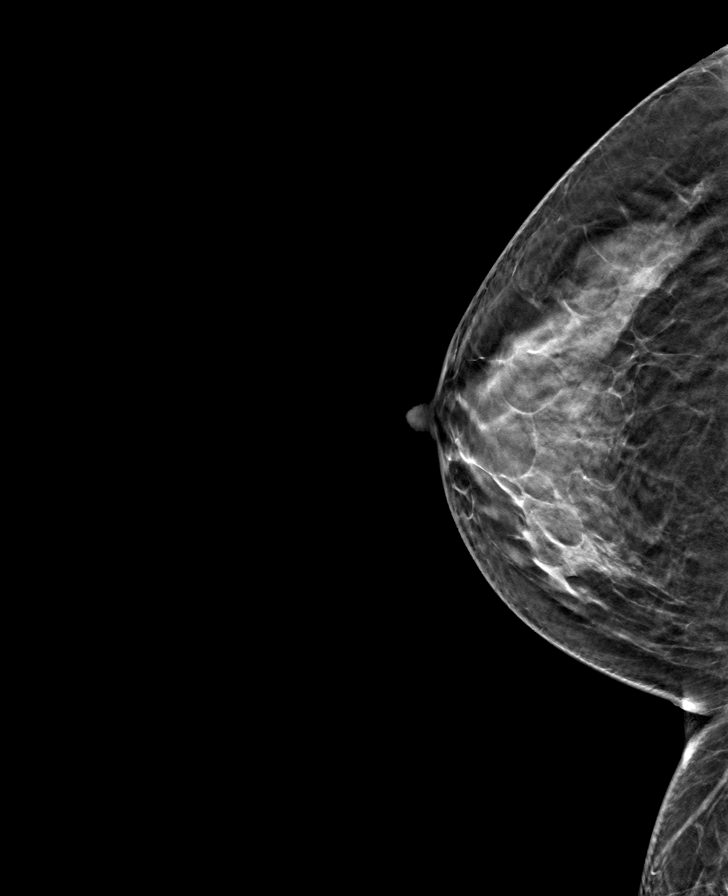

[L MLO tomo · tomo slice 31/60.0]
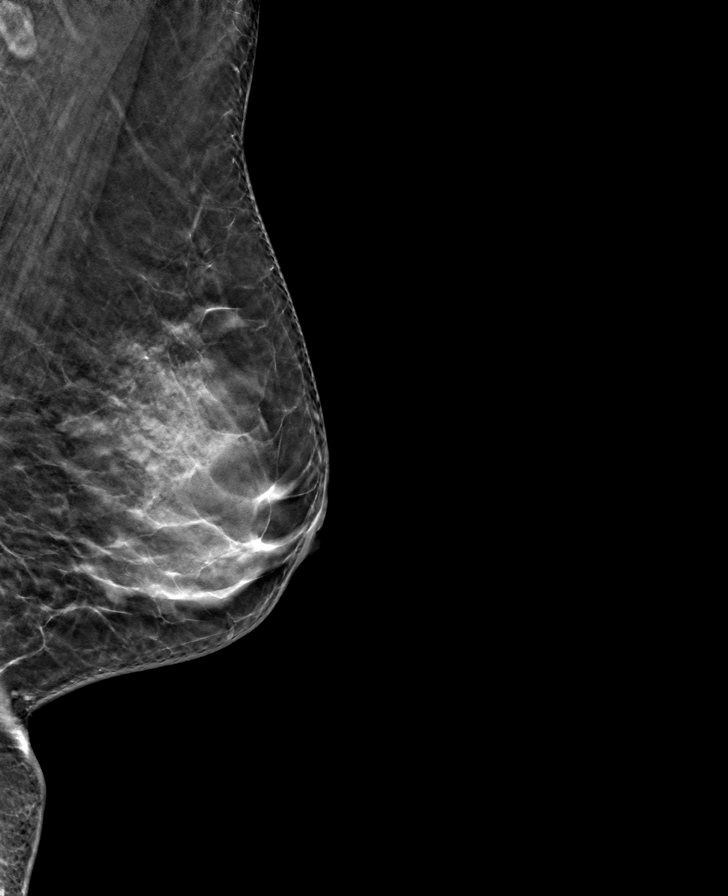

[R MLO tomo · tomo slice 31/60.0]
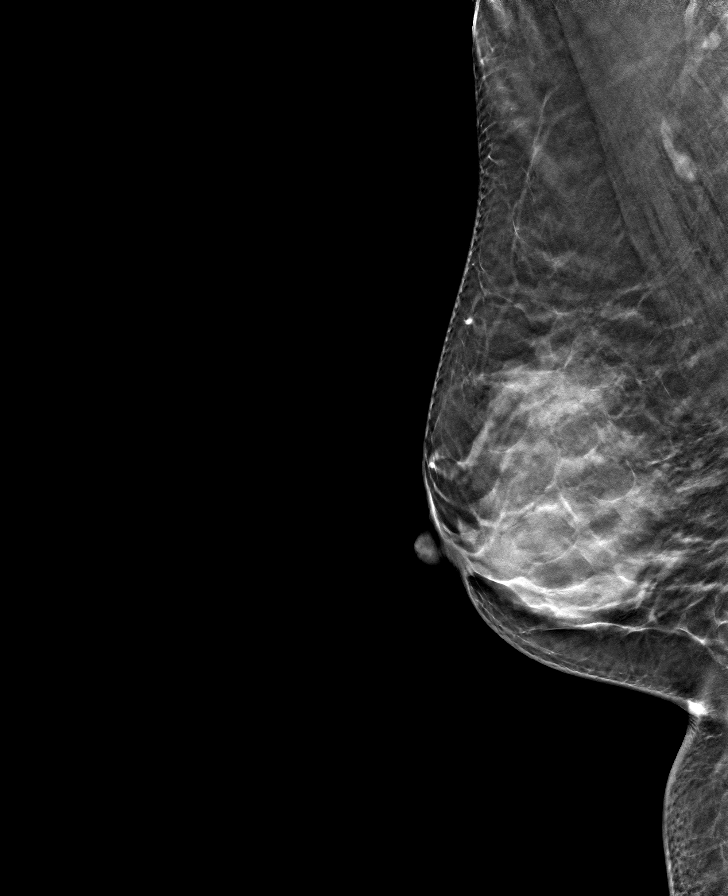

[L CC tomo · tomo slice 29/57.0]
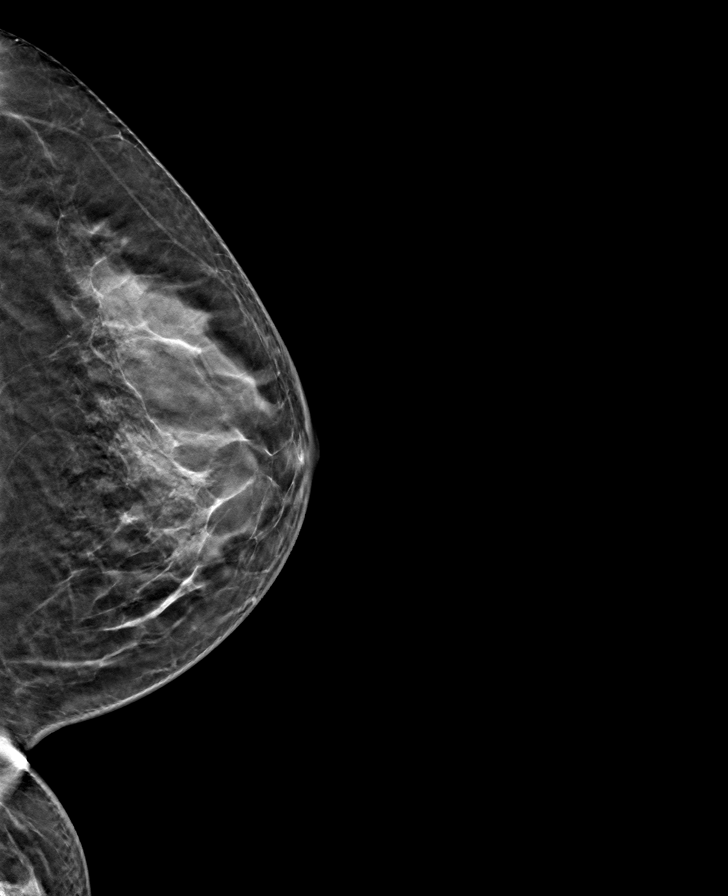

[8 of 24 positions shown; findings below may reference images not displayed]

ACR Breast Density Category c: The breast tissue is heterogeneously
dense, which may obscure small masses.
FINDINGS: No suspicious mass, malignant type microcalcifications or distortion
detected in either breast.
IMPRESSION: No evidence of malignancy in either breast.

RECOMMENDATION:
Bilateral screening mammogram in 1 year is recommended.

I have discussed the findings and recommendations with the patient.
If applicable, a reminder letter will be sent to the patient
regarding the next appointment.

BI-RADS CATEGORY  1: Negative.

## 2023-10-16 DIAGNOSIS — K219 Gastro-esophageal reflux disease without esophagitis: Secondary | ICD-10-CM | POA: Insufficient documentation

## 2024-02-13 ENCOUNTER — Other Ambulatory Visit: Payer: Self-pay | Admitting: Obstetrics and Gynecology

## 2024-02-13 DIAGNOSIS — Z1231 Encounter for screening mammogram for malignant neoplasm of breast: Secondary | ICD-10-CM

## 2024-03-16 ENCOUNTER — Ambulatory Visit
Admission: RE | Admit: 2024-03-16 | Discharge: 2024-03-16 | Disposition: A | Discharge status: Home / Self Care | Source: Ambulatory Visit | Attending: Obstetrics and Gynecology | Admitting: Obstetrics and Gynecology

## 2024-03-16 DIAGNOSIS — Z1231 Encounter for screening mammogram for malignant neoplasm of breast: Secondary | ICD-10-CM | POA: Diagnosis present

## 2024-03-18 ENCOUNTER — Encounter: Payer: Self-pay | Admitting: Nurse Practitioner

## 2024-03-20 ENCOUNTER — Other Ambulatory Visit: Payer: Self-pay | Admitting: Obstetrics and Gynecology

## 2024-03-20 DIAGNOSIS — R928 Other abnormal and inconclusive findings on diagnostic imaging of breast: Secondary | ICD-10-CM

## 2024-04-01 ENCOUNTER — Ambulatory Visit
Admission: RE | Admit: 2024-04-01 | Discharge: 2024-04-01 | Disposition: A | Discharge status: Home / Self Care | Source: Ambulatory Visit | Attending: Obstetrics and Gynecology | Admitting: Obstetrics and Gynecology

## 2024-04-01 ENCOUNTER — Other Ambulatory Visit: Payer: Self-pay | Admitting: Obstetrics and Gynecology

## 2024-04-01 ENCOUNTER — Ambulatory Visit
Admission: RE | Admit: 2024-04-01 | Discharge: 2024-04-01 | Disposition: A | Source: Ambulatory Visit | Attending: Obstetrics and Gynecology | Admitting: Obstetrics and Gynecology

## 2024-04-01 DIAGNOSIS — R928 Other abnormal and inconclusive findings on diagnostic imaging of breast: Secondary | ICD-10-CM | POA: Insufficient documentation

## 2024-04-02 ENCOUNTER — Ambulatory Visit
Admission: RE | Admit: 2024-04-02 | Discharge: 2024-04-02 | Disposition: A | Discharge status: Home / Self Care | Source: Ambulatory Visit | Attending: Obstetrics and Gynecology | Admitting: Obstetrics and Gynecology

## 2024-04-02 DIAGNOSIS — R928 Other abnormal and inconclusive findings on diagnostic imaging of breast: Secondary | ICD-10-CM | POA: Diagnosis present

## 2024-04-02 DIAGNOSIS — C50511 Malignant neoplasm of lower-outer quadrant of right female breast: Secondary | ICD-10-CM | POA: Insufficient documentation

## 2024-04-02 MED ORDER — LIDOCAINE 1 % OPTIME INJ - NO CHARGE
2.0000 mL | Freq: Once | INTRAMUSCULAR | Status: AC
  Administered 2024-04-02: 2 mL via INTRADERMAL
  Filled 2024-04-02: qty 2

## 2024-04-02 MED ORDER — LIDOCAINE-EPINEPHRINE 1 %-1:100000 IJ SOLN
10.0000 mL | Freq: Once | INTRAMUSCULAR | Status: AC
  Administered 2024-04-02: 10 mL via INTRADERMAL
  Filled 2024-04-02: qty 10

## 2024-04-03 ENCOUNTER — Encounter: Payer: Self-pay | Admitting: *Deleted

## 2024-04-03 DIAGNOSIS — C50911 Malignant neoplasm of unspecified site of right female breast: Secondary | ICD-10-CM

## 2024-04-03 LAB — SURGICAL PATHOLOGY

## 2024-04-03 NOTE — Progress Notes (Signed)
 Received referral for newly diagnosed breast cancer from [facility].  Navigation initiated.  Ms. Alexa will see Dr. Jacobo on 04/08/24 at 1:00.   Referral has been sent to Dr. Desiderio, his office will call with the appointment.

## 2024-04-08 ENCOUNTER — Encounter: Payer: Self-pay | Admitting: Surgery

## 2024-04-08 ENCOUNTER — Inpatient Hospital Stay

## 2024-04-08 ENCOUNTER — Encounter: Payer: Self-pay | Admitting: *Deleted

## 2024-04-08 ENCOUNTER — Encounter: Payer: Self-pay | Admitting: Oncology

## 2024-04-08 ENCOUNTER — Inpatient Hospital Stay: Admitting: Oncology

## 2024-04-08 ENCOUNTER — Ambulatory Visit: Payer: Self-pay | Admitting: Surgery

## 2024-04-08 VITALS — BP 135/97 | HR 65 | Resp 18 | Ht 62.0 in | Wt 144.0 lb

## 2024-04-08 VITALS — BP 139/90 | HR 73 | Temp 98.5°F | Ht 62.0 in | Wt 144.8 lb

## 2024-04-08 DIAGNOSIS — C50511 Malignant neoplasm of lower-outer quadrant of right female breast: Secondary | ICD-10-CM

## 2024-04-08 DIAGNOSIS — C50911 Malignant neoplasm of unspecified site of right female breast: Secondary | ICD-10-CM

## 2024-04-08 DIAGNOSIS — D7589 Other specified diseases of blood and blood-forming organs: Secondary | ICD-10-CM | POA: Insufficient documentation

## 2024-04-08 DIAGNOSIS — F419 Anxiety disorder, unspecified: Secondary | ICD-10-CM | POA: Insufficient documentation

## 2024-04-08 NOTE — Progress Notes (Signed)
 " 04/08/2024  Reason for Visit: Right breast invasive ductal carcinoma  Requesting Provider: Heather Penton, MD  History of Present Illness: Tammy Gay is a 69 y.o. female presenting for evaluation of right breast invasive ductal carcinoma.  The patient had a routine screening mammogram on 03/16/2024 which showed a possible mass in the right breast.  She had diagnostic mammogram and ultrasound on 04/01/2024 which showed a spiculated right breast mass measuring 15 x 13 x 8 mm at 8 o'clock position, 3 cm from the nipple.  The right axilla did not have any suspicious lymphadenopathy.  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  Hormone receptors are ER/PR positive and HER2 negative.  The patient met with Dr. Georgina again with the oncology team today and presents today for discussion of surgery.  Patient does have a history of hormone therapy, does not have any family history of breast cancer.  First age of menstrual cycle was 51, she is G3 P2 and she was 69 years old at her first pregnancy.  Did not breast-feed.  She did not feel any masses or changes in the breast.  Denies any skin changes or nipple changes.  She does report bruising and discomfort after the biopsy but this has been improving.  Past Medical History: Past Medical History:  Diagnosis Date   Anxiety      Past Surgical History: Past Surgical History:  Procedure Laterality Date   APPENDECTOMY     BREAST BIOPSY Right 04/02/2024   u/s rt breast coil clip path pending   BREAST BIOPSY Right 04/02/2024   US  RT BREAST BX W LOC DEV 1ST LESION IMG BX SPEC US  GUIDE 04/02/2024 [facility]   CARPAL TUNNEL RELEASE     JOINT REPLACEMENT     right elbow operation    Home Medications: Prior to Admission medications  Medication Sig Start Date End Date Taking? Authorizing Provider  alendronate (FOSAMAX) 70 MG tablet Take 70 mg by mouth every 7 (seven) days.  03/31/24  Yes [provider]  escitalopram (LEXAPRO) 10 MG tablet Take 10 mg by mouth daily.   Yes [provider]  pantoprazole (PROTONIX) 40 MG tablet Take by mouth.   Yes [provider]  traZODone (DESYREL) 100 MG tablet Take 100 mg by mouth at bedtime.   Yes [provider]    Allergies: Allergies[1]  Social History:  reports that she has never smoked. She does not have any smokeless tobacco history on file. She reports current alcohol use of about 1.0 standard drink of alcohol per week. She reports that she does not use drugs.   Family History: History reviewed. No pertinent family history.  Review of Systems: Review of Systems  Constitutional:  Negative for chills and fever.  HENT:  Negative for hearing loss.   Respiratory:  Negative for shortness of breath.   Cardiovascular:  Negative for chest pain.  Gastrointestinal:  Negative for nausea and vomiting.  Genitourinary:  Negative for dysuria.  Musculoskeletal:  Negative for myalgias.  Skin:        Bruising and discomfort right lower breast  Neurological:  Negative for dizziness.  Psychiatric/Behavioral:  Negative for depression.     Physical Exam BP (!) 139/90   Pulse 73   Temp 98.5 F (36.9 C) (Oral)   Ht 5' 2 (1.575 m)   Wt 144 lb 12.8 oz (65.7 kg)   SpO2 94%   BMI 26.48  kg/m  CONSTITUTIONAL: No acute distress, well-nourished HEENT:  Normocephalic, atraumatic, extraocular motion intact. NECK: Trachea is midline, and there is no jugular venous distension.  RESPIRATORY:  Lungs are clear, and breath sounds are equal bilaterally. Normal respiratory effort without pathologic use of accessory muscles. CARDIOVASCULAR: Heart is regular without murmurs, gallops, or rubs. BREAST: Right breast status post biopsy from anterior approach with Steri-Strips still in place.  There is ecchymosis that appears to be resolving at the inferior portion of the right breast.  Otherwise no palpable  masses or skin changes or nipple changes.  There is no nipple retraction.  There is no right axillary lymphadenopathy.  Left breast without any palpable masses, skin changes, nipple changes.  No left axillary lymphadenopathy. MUSCULOSKELETAL:  Normal muscle strength and tone in all four extremities.  No peripheral edema or cyanosis. SKIN: Skin turgor is normal. There are no pathologic skin lesions.  NEUROLOGIC:  Motor and sensation is grossly normal.  Cranial nerves are grossly intact. PSYCH:  Alert and oriented to person, place and time. Affect is normal.  Laboratory Analysis: Right breast biopsy on 04/02/2024: FINAL DIAGNOSIS       1. Breast, right, needle core biopsy, 8 o'clock, 3cmfn, 15mm, coil clip :       INVASIVE DUCTAL CARCINOMA, SEE NOTE       DUCTAL CARCINOMA IN SITU, CRIBRIFORM, INTERMEDIATE NUCLEAR GRADE, WITHOUT       NECROSIS       TUBULE FORMATION: SCORE 3       NUCLEAR PLEOMORPHISM: SCORE 2       MITOTIC COUNT: SCORE 1       TOTAL SCORE: 6       OVERALL GRADE: 2       LYMPHOVASCULAR INVASION: NOT IDENTIFIED       CANCER LENGTH: 1.1 CM       CALCIFICATIONS: NOT IDENTIFIED       OTHER FINDINGS: NONE   Labs from 01/10/2024: Sodium 140, potassium 4.2, chloride 105, CO2 31.5, BUN 9, creatinine 0.7.  LFTs within normal limits.  Hemoglobin A1c 5.7.  Imaging: Right breast mammogram and ultrasound on 04/01/2024: FINDINGS: Full field lateral and spot compression view(s) were performed and demonstrate an approximately 13 mm spiculated mass in the RIGHT breast at the 8 to 9 o'clock position, middle depth. There is associated architectural distortion.   There is subtle nipple retraction on the RIGHT ML view which likely reflects the patient's baseline given similar appearance bilaterally on mammograms dating back to 2009, however, it is slightly more conspicuous than on prior imaging.   Targeted ultrasound of the RIGHT breast demonstrates an irregular, spiculated hypoechoic mass at  the 8 o'clock position 3 cm from the nipple measuring 15 x 13 x 8 mm. There is no RIGHT axillary lymphadenopathy.   IMPRESSION: 1. Highly suspicious 15 mm RIGHT breast mass at 8 o'clock, correlating with the abnormality described at the time of screening. Ultrasound-guided biopsy is recommended and has been scheduled for tomorrow, 04/02/2024.   2.  No RIGHT axillary lymphadenopathy is seen.   3. Mammographically apparent nipple retraction is favored to be chronic/baseline. Given proximity of the mass to the nipple and slightly increased conspicuity of the finding on the screening mammogram, recommend RIGHT RETROAREOLAR ultrasound at the time of the above recommended biopsy, with consideration of pretreatment breast MRI to exclude nipple involvement and fully evaluate extent of disease in the setting of heterogeneously dense breast tissue.   RECOMMENDATION: Ultrasound-guided biopsy of the RIGHT breast x1.  RETROAREOLAR evaluation at the time of that biopsy (no separate order needed). Pretreatment breast MRI after results of the biopsy are available.   I have discussed the findings and recommendations with the patient. The recommended procedure was discussed with the patient and questions were answered. Patient expressed their understanding of the recommendation. Patient will be scheduled for the procedure at her earliest convenience by the schedulers. Ordering provider will be notified. If applicable, a reminder letter will be sent to the patient regarding the next appointment.   BI-RADS CATEGORY  5: Highly suggestive of malignancy.  Assessment and Plan: This is a 69 y.o. female with newly diagnosed right breast cancer.  - Discussed with the patient and her family the findings on the imaging studies and biopsy.  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  Discussed with the patient the  potential surgical options including lumpectomy and sentinel lymph node biopsy versus mastectomy and sentinel lymph node biopsy and reviewed both surgeries at length and after further discussion, she has opted to proceed with lumpectomy and sentinel lymph node biopsy.  Discussed with her that after her surgery is completed, she will undergo radiation therapy and endocrine therapy.  There may be a chance of needing chemotherapy but all depends on potential Oncotype scoring.  The patient is in agreement with this. - Discussed with her then the plan for a right breast tag localized lumpectomy and sentinel lymph node biopsy.  Reviewed with her the surgery at length including preoperative tag localization at the [facility] breast center, day of surgery radioactive tracer injection to the right nipple, intraoperative injection of methylene blue dye to the right nipple, the process for a sentinel lymph node biopsy as well as lumpectomy, the planned incisions, risks of bleeding, infection, injury to surrounding structures, that this would be an outpatient procedure, postoperative activity restrictions, pain control, and she is willing to proceed. - We will schedule her for surgery on 04/21/2024.  This will give us  enough time for tag localization placement.  All of her questions have been answered.  I spent 55 minutes dedicated to the care of this patient on the date of this encounter to include pre-visit review of records, face-to-face time with the patient discussing diagnosis and management, and any post-visit coordination of care.   Aloysius Sheree Plant, MD [facility]       [1]  Allergies Allergen Reactions   Sulfa Antibiotics Nausea Only   "

## 2024-04-08 NOTE — Progress Notes (Signed)
 Accompanied patient and family to initial medical oncology appointment.   Reviewed Breast Cancer treatment handbook.   Care plan summary given to patient.   Reviewed outreach programs and cancer center services.

## 2024-04-08 NOTE — Progress Notes (Unsigned)
 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  Subsequent ultrasound biopsy confirmed the above diagnosis.  She currently feels well and is asymptomatic.  She has no neurologic complaints.  She denies any recent fevers or illnesses.  She has a good appetite and denies weight loss.  She has no chest pain, shortness of breath, cough, or hemoptysis.  She denies any nausea, vomiting, constipation, or diarrhea.  She has no urinary complaints.  Patient offers no further specific complaints today.  REVIEW OF SYSTEMS:   Review of Systems  Constitutional: Negative.  Negative for fever, malaise/fatigue and weight loss.  Respiratory: Negative.  Negative for cough, hemoptysis and shortness of breath.   Cardiovascular: Negative.  Negative for chest pain and leg swelling.  Gastrointestinal: Negative.  Negative for abdominal pain.  Genitourinary: Negative.  Negative for dysuria.  Musculoskeletal: Negative.  Negative for back pain.  Skin: Negative.  Negative for rash.  Neurological: Negative.  Negative for dizziness, focal weakness, weakness and headaches.  Psychiatric/Behavioral: Negative.  The patient is not nervous/anxious.     As per HPI. Otherwise, a complete review of systems is negative.  PAST MEDICAL HISTORY: Past Medical History:  Diagnosis Date   Anxiety     PAST SURGICAL HISTORY: Past Surgical History:  Procedure Laterality  Date   APPENDECTOMY     BREAST BIOPSY Right 04/02/2024   u/s rt breast coil clip path pending   BREAST BIOPSY Right 04/02/2024   US  RT BREAST BX W LOC DEV 1ST LESION IMG BX SPEC US  GUIDE 04/02/2024 [facility]   CARPAL TUNNEL RELEASE     JOINT REPLACEMENT     right elbow operation    FAMILY HISTORY: History reviewed. No pertinent family history.  ADVANCED DIRECTIVES (Y/N):  N  HEALTH MAINTENANCE: Social History[1]   Colonoscopy:  PAP:  Bone density:  Lipid panel:  Allergies[2]  Current Outpatient Medications  Medication Sig Dispense Refill   alendronate (FOSAMAX) 70 MG tablet Take 70 mg by mouth every 7 (seven) days.     escitalopram (LEXAPRO) 10 MG tablet Take 10 mg by mouth daily.     pantoprazole (PROTONIX) 40 MG tablet Take by mouth.     traZODone (DESYREL) 100 MG tablet Take 100 mg by mouth at bedtime.     No current facility-administered medications for this visit.    OBJECTIVE: Vitals:   04/08/24 1303  BP: (!) 135/97  Pulse: 65  Resp: 18  SpO2: 96%     Body mass index is 26.34 kg/m.    ECOG FS:0 - Asymptomatic  General: Well-developed, well-nourished, no acute distress. Eyes: Pink conjunctiva, anicteric sclera. HEENT: Normocephalic, moist mucous membranes. Breast: Exam deferred today. Lungs: No audible wheezing or coughing. Heart: Regular rate and rhythm. Abdomen: Soft, nontender, no obvious distention. Musculoskeletal: No edema, cyanosis, or clubbing. Neuro: Alert, answering all questions appropriately. Cranial nerves grossly intact. Skin: No rashes or petechiae noted. Psych: Normal affect. Lymphatics: No cervical, calvicular, axillary or inguinal LAD.   LAB RESULTS:  Lab Results  Component Value Date   NA 140 08/20/2011   K 3.6 08/20/2011   CL 103 08/20/2011   CO2 26 08/20/2011   GLUCOSE 109 (H) 08/20/2011   BUN 13 08/20/2011   CREATININE 0.62 08/20/2011   CALCIUM 9.3 08/20/2011   PROT 7.5 08/20/2011   ALBUMIN 3.7 08/20/2011   AST  18 08/20/2011   ALT 13 08/20/2011   ALKPHOS 62 08/20/2011   BILITOT 0.8 08/20/2011   GFRNONAA >90 08/20/2011   GFRAA >90 08/20/2011    Lab Results  Component Value Date   WBC 5.6 08/20/2011   NEUTROABS 3.5 08/20/2011   HGB 14.9 08/20/2011   HCT 44.0 08/20/2011   MCV 99.3 08/20/2011   PLT 221 08/20/2011     STUDIES: US  RT BREAST BX W LOC DEV 1ST LESION IMG BX SPEC US  GUIDE Addendum Date: 04/03/2024 ADDENDUM REPORT: 04/03/2024 10:08 ADDENDUM: PATHOLOGY revealed: Breast, right, needle core biopsy, 8 o'clock, 3 cmfn, 15 mm, coil clip- INVASIVE DUCTAL CARCINOMA- DUCTAL CARCINOMA IN SITU, CRIBRIFORM, INTERMEDIATE NUCLEAR GRADE, WITHOUT NECROSIS- OVERALL GRADE: 2- LYMPHOVASCULAR INVASION: NOT IDENTIFIED- CANCER LENGTH: 1.1 CM- CALCIFICATIONS: NOT IDENTIFIED. Pathology results are CONCORDANT with imaging findings, per Dr. Norleen Croak. Pathology results and recommendations below were discussed with patient by telephone. Patient reported biopsy site doing well with no adverse symptoms, and only slight tenderness at the site. Post biopsy care instructions were reviewed, questions were answered and my direct phone number was provided. Patient was instructed to call [facility] for any additional questions or concerns related to biopsy site. RECOMMENDATIONS: 1. Surgical and oncological consultation. Request for surgical and oncological consultation was relayed to Shasta Ada, Nurse Navigator at [facility]. 2. Recommend pretreatment bilateral breast MRI with and without contrast to determine extent of breast disease/exclude nipple involvement. Pathology results reported by Mliss CHARM Molt RN 04/03/2024. Electronically Signed   By: Norleen Croak M.D.   On: 04/03/2024 10:08   Result Date: 04/03/2024 CLINICAL DATA:  Highly suspicious RIGHT breast mass at 8 o'clock 3 cm from the nipple EXAM: ULTRASOUND GUIDED RIGHT BREAST CORE NEEDLE BIOPSY COMPARISON:  Previous exam(s). PROCEDURE:  Prior to the biopsy complete RIGHT RETROAREOLAR ultrasound was performed, without suspicious sonographic abnormality. Representative negative pictures were taken. There is no visible nipple retraction on physical examination. I met with the patient and we discussed the procedure of ultrasound-guided biopsy, including benefits and alternatives. We discussed the high likelihood of a successful procedure. We discussed the risks of the procedure, including infection, bleeding, tissue injury, clip migration, and inadequate sampling. Informed written consent was given. The usual time-out protocol was performed immediately prior to the procedure. Lesion quadrant: Lower outer Using sterile technique and 1% lidocaine  and 1% lidocaine  with epinephrine  as local anesthetic, under direct ultrasound visualization, a 14 gauge spring-loaded device was used to perform biopsy of the RIGHT breast mass at 8 o'clock 3 cm from the nipple using a inferior approach. At the conclusion of the procedure coil shaped tissue marker clip was deployed into the biopsy cavity. Follow up 2 view mammogram was performed and dictated separately. IMPRESSION: 1. Ultrasound guided biopsy of the RIGHT breast mass at 8 o'clock 3 cm from the nipple. No apparent complications. 2. Complete RETROAREOLAR ultrasound was performed without suspicious sonographic abnormality. There is no visible nipple retraction on physical examination. Electronically Signed: By: Norleen Croak M.D. On: 04/02/2024 09:03   MM CLIP PLACEMENT RIGHT Result Date: 04/02/2024 CLINICAL DATA:  Status post RIGHT breast ultrasound-guided biopsy  EXAM: 3D DIAGNOSTIC RIGHT MAMMOGRAM POST ULTRASOUND BIOPSY COMPARISON:  Previous exam(s). ACR Breast Density Category c: The breasts are heterogeneously dense, which may obscure small masses. FINDINGS: 3D Mammographic images were obtained following ultrasound guided biopsy of the RIGHT breast mass at 8 o'clock 3 cm from the nipple. The biopsy marking  clip is in expected position at the site of biopsy. IMPRESSION: Appropriate positioning of the coil shaped biopsy marking clip at the site of biopsy in the RIGHT breast. Final Assessment: Post Procedure Mammograms for Marker Placement Electronically Signed   By: Norleen Croak M.D.   On: 04/02/2024 09:13   MM 3D DIAGNOSTIC MAMMOGRAM UNILATERAL RIGHT BREAST Result Date: 04/01/2024 CLINICAL DATA:  Screening recall RIGHT breast mass EXAM: DIGITAL DIAGNOSTIC UNILATERAL RIGHT MAMMOGRAM WITH TOMOSYNTHESIS AND CAD; ULTRASOUND RIGHT BREAST LIMITED TECHNIQUE: Right digital diagnostic mammography and breast tomosynthesis was performed. The images were evaluated with computer-aided detection. ; Targeted ultrasound examination of the right breast was performed COMPARISON:  Previous exam(s). ACR Breast Density Category c: The breasts are heterogeneously dense, which may obscure small masses. FINDINGS: Full field lateral and spot compression view(s) were performed and demonstrate an approximately 13 mm spiculated mass in the RIGHT breast at the 8 to 9 o'clock position, middle depth. There is associated architectural distortion. There is subtle nipple retraction on the RIGHT ML view which likely reflects the patient's baseline given similar appearance bilaterally on mammograms dating back to 2009, however, it is slightly more conspicuous than on prior imaging. Targeted ultrasound of the RIGHT breast demonstrates an irregular, spiculated hypoechoic mass at the 8 o'clock position 3 cm from the nipple measuring 15 x 13 x 8 mm. There is no RIGHT axillary lymphadenopathy. IMPRESSION: 1. Highly suspicious 15 mm RIGHT breast mass at 8 o'clock, correlating with the abnormality described at the time of screening. Ultrasound-guided biopsy is recommended and has been scheduled for tomorrow, 04/02/2024. 2.  No RIGHT axillary lymphadenopathy is seen. 3. Mammographically apparent nipple retraction is favored to be chronic/baseline. Given  proximity of the mass to the nipple and slightly increased conspicuity of the finding on the screening mammogram, recommend RIGHT RETROAREOLAR ultrasound at the time of the above recommended biopsy, with consideration of pretreatment breast MRI to exclude nipple involvement and fully evaluate extent of disease in the setting of heterogeneously dense breast tissue. RECOMMENDATION: Ultrasound-guided biopsy of the RIGHT breast x1. RETROAREOLAR evaluation at the time of that biopsy (no separate order needed). Pretreatment breast MRI after results of the biopsy are available. I have discussed the findings and recommendations with the patient. The recommended procedure was discussed with the patient and questions were answered. Patient expressed their understanding of the recommendation. Patient will be scheduled for the procedure at her earliest convenience by the schedulers. Ordering provider will be notified. If applicable, a reminder letter will be sent to the patient regarding the next appointment. BI-RADS CATEGORY  5: Highly suggestive of malignancy. Electronically Signed   By: Norleen Croak M.D.   On: 04/01/2024 16:27   US  LIMITED ULTRASOUND INCLUDING AXILLA RIGHT BREAST Result Date: 04/01/2024 CLINICAL DATA:  Screening recall RIGHT breast mass EXAM: DIGITAL DIAGNOSTIC UNILATERAL RIGHT MAMMOGRAM WITH TOMOSYNTHESIS AND CAD; ULTRASOUND RIGHT BREAST LIMITED TECHNIQUE: Right digital diagnostic mammography and breast tomosynthesis was performed. The images were evaluated with computer-aided detection. ; Targeted ultrasound examination of the right breast was performed COMPARISON:  Previous exam(s). ACR Breast Density Category c: The breasts are heterogeneously dense, which may obscure small masses. FINDINGS: Full field lateral and spot  compression view(s) were performed and demonstrate an approximately 13 mm spiculated mass in the RIGHT breast at the 8 to 9 o'clock position, middle depth. There is associated  architectural distortion. There is subtle nipple retraction on the RIGHT ML view which likely reflects the patient's baseline given similar appearance bilaterally on mammograms dating back to 2009, however, it is slightly more conspicuous than on prior imaging. Targeted ultrasound of the RIGHT breast demonstrates an irregular, spiculated hypoechoic mass at the 8 o'clock position 3 cm from the nipple measuring 15 x 13 x 8 mm. There is no RIGHT axillary lymphadenopathy. IMPRESSION: 1. Highly suspicious 15 mm RIGHT breast mass at 8 o'clock, correlating with the abnormality described at the time of screening. Ultrasound-guided biopsy is recommended and has been scheduled for tomorrow, 04/02/2024. 2.  No RIGHT axillary lymphadenopathy is seen. 3. Mammographically apparent nipple retraction is favored to be chronic/baseline. Given proximity of the mass to the nipple and slightly increased conspicuity of the finding on the screening mammogram, recommend RIGHT RETROAREOLAR ultrasound at the time of the above recommended biopsy, with consideration of pretreatment breast MRI to exclude nipple involvement and fully evaluate extent of disease in the setting of heterogeneously dense breast tissue. RECOMMENDATION: Ultrasound-guided biopsy of the RIGHT breast x1. RETROAREOLAR evaluation at the time of that biopsy (no separate order needed). Pretreatment breast MRI after results of the biopsy are available. I have discussed the findings and recommendations with the patient. The recommended procedure was discussed with the patient and questions were answered. Patient expressed their understanding of the recommendation. Patient will be scheduled for the procedure at her earliest convenience by the schedulers. Ordering provider will be notified. If applicable, a reminder letter will be sent to the patient regarding the next appointment. BI-RADS CATEGORY  5: Highly suggestive of malignancy. Electronically Signed   By: Norleen Croak M.D.    On: 04/01/2024 16:27   MM 3D SCREENING MAMMOGRAM BILATERAL BREAST Result Date: 03/18/2024 CLINICAL DATA:  Screening. EXAM: DIGITAL SCREENING BILATERAL MAMMOGRAM WITH TOMOSYNTHESIS AND CAD TECHNIQUE: Bilateral screening digital craniocaudal and mediolateral oblique mammograms were obtained. Bilateral screening digital breast tomosynthesis was performed. The images were evaluated with computer-aided detection. COMPARISON:  Previous exam(s). ACR Breast Density Category c: The breasts are heterogeneously dense, which may obscure small masses. FINDINGS: In the right breast, a possible mass warrants further evaluation. In the left breast, no findings suspicious for malignancy. IMPRESSION: Further evaluation is suggested for a possible mass in the right breast. RECOMMENDATION: Diagnostic mammogram and possibly ultrasound of the right breast. (Code:[id number]) The patient will be contacted regarding the findings, and additional imaging will be scheduled. BI-RADS CATEGORY  0: Incomplete: Need additional imaging evaluation. Electronically Signed   By: Alm Parkins M.D.   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  Patient has surgical consultation later this afternoon to discuss initial treatment with either lumpectomy or mastectomy.  Will arrange follow-up with radiation oncology if patient pursues lumpectomy.  It is unclear whether or not she will require adjuvant chemotherapy, but will send Oncotype on surgical specimen through determine whether she has high risk.  Finally, patient would benefit from an aromatase inhibitor for 5 years at the conclusion of our treatments.  Return to clinic approximately 2 weeks postoperatively to discuss her final pathology results and  additional treatment  planning.  I spent a total of 60 minutes reviewing chart data, face-to-face evaluation with the patient, counseling and coordination of care as detailed above.   Patient expressed understanding and was in agreement with this plan. She also understands that She can call clinic at any time with any questions, concerns, or complaints.    Cancer Staging  Invasive ductal carcinoma of right breast [facility]) Staging form: Breast, AJCC 8th Edition - Clinical stage from 04/08/2024: Stage IA (cT1c, cN0, cM0, G2, ER+, PR+, HER2-) - Signed by Jacobo Evalene PARAS, MD on 04/08/2024 Stage prefix: Initial diagnosis Histologic grading system: 3 grade system   Evalene PARAS Jacobo, MD   04/09/2024 9:24 AM         [1]  Social History Tobacco Use   Smoking status: Never  Substance Use Topics   Alcohol use: Yes    Alcohol/week: 1.0 standard drink of alcohol    Types: 1 Glasses of wine per week   Drug use: No  [2]  Allergies Allergen Reactions   Sulfa Antibiotics Nausea Only   "

## 2024-04-08 NOTE — Patient Instructions (Signed)
 We have spoken today about removing a lump in your breast. This will be done by Dr. Mauri Sous at [facility].  If you are on any injectable weight loss medication, you will need to stop taking your GLP-1 injectable (weight loss) medications 8 days before your surgery to avoid any complications with anesthesia.   You will most likely be able to leave the hospital several hours after your surgery. Rarely, a patient needs to stay over night but this is a possibility.  Plan to tentatively be off work for 1-2 weeks following the surgery and may return with approximately 2 more weeks of a lifting restriction, no greater than 15 lbs.  Please see your Blue surgery sheet for more information. Our surgery scheduler will call you to look at surgery dates and to go over information.   If you have FMLA or Disability paperwork that needs to be filled out, please have your company fax your paperwork to [phone] or you may drop this by either office. This paperwork will be filled out within 3 days after your surgery has been completed.    Lumpectomy A lumpectomy is a form of breast conserving or breast preservation surgery. It may also be referred to as a partial mastectomy. During a lumpectomy, the portion of the breast that contains the cancerous tumor or breast mass (the lump) is removed. Some normal tissue around the lump may also be removed to make sure all of the tumor has been removed.  LET [facility] CARE PROVIDER KNOW ABOUT: Any allergies you have. All medicines you are taking, including vitamins, herbs, eye drops, creams, and over-the-counter medicines. Previous problems you or members of your family have had with the use of anesthetics. Any blood disorders you have. Previous surgeries you have had. Medical conditions you have. RISKS AND COMPLICATIONS Generally, this is a safe procedure. However, problems can occur and include: Bleeding. Infection. Pain. Temporary swelling. Change in the  shape of the breast, particularly if a large portion is removed. BEFORE THE PROCEDURE Ask your health care provider about changing or stopping your regular medicines. This is especially important if you are taking diabetes medicines or blood thinners. Do not eat or drink anything after midnight on the night before the procedure or as directed by your health care provider. Ask your health care provider if you can take a sip of water with any approved medicines. On the day of surgery, your health care provider will use a mammogram or ultrasound to locate and mark the tumor in your breast. These markings on your breast will show where the cut (incision) will be made.   PROCEDURE  An IV tube will be put into one of your veins. You may be given medicine to help you relax before the surgery (sedative). You will be given one of the following: A medicine that numbs the area (local anesthetic). A medicine that makes you fall asleep (general anesthetic). Your health care provider will use a kind of electric scalpel that uses heat to minimize bleeding (electrocautery knife). A curved incision (like a smile or frown) that follows the natural curve of your breast is made, to allow for minimal scarring and better healing. The tumor will be removed with some of the surrounding tissue. This will be sent to the lab for analysis. Your health care provider may also remove your lymph nodes at this time if needed. Sometimes, but not always, a rubber tube called a drain will be surgically inserted into your breast area or  armpit to collect excess fluid that may accumulate in the space where the tumor was. This drain is connected to a plastic bulb on the outside of your body. This drain creates suction to help remove the fluid. The incisions will be closed with stitches (sutures). A bandage may be placed over the incisions. AFTER THE PROCEDURE You will be taken to the recovery area. You will be given medicine for  pain. A small rubber drain may be placed in the breast for 2-3 days to prevent a collection of blood (hematoma) from developing in the breast. You will be given instructions on caring for the drain before you go home. A pressure bandage (dressing) will be applied for 1-2 days to prevent bleeding. Ask your health care provider how to care for your bandage at home.   This information is not intended to replace advice given to you by your health care provider. Make sure you discuss any questions you have with your health care provider.   Document Released: 04/02/2006 Document Revised: 03/12/2014 Document Reviewed: 07/25/2012 Elsevier Interactive Patient Education [employer]

## 2024-04-08 NOTE — Progress Notes (Signed)
 Multidisciplinary Oncology Council Documentation  Shanena Pellegrino was presented by our [facility] on 04/08/2024, which included representatives from:  Palliative Care Dietitian  Physical/Occupational Therapist Nurse Navigator Genetics Social work Survivorship RN Financial Navigator Research RN   Hellena currently presents with history of breast cancer  We reviewed previous medical and familial history, history of present illness, and recent lab results along with all available histopathologic and imaging studies. The MOC considered available treatment options and made the following recommendations/referrals:  None  The MOC is a meeting of clinicians from various specialty areas who evaluate and discuss patients for whom a multidisciplinary approach is being considered. Final determinations in the plan of care are those of the provider(s).   Today's extended care, comprehensive team conference, Stephinie was not present for the discussion and was not examined.

## 2024-04-09 ENCOUNTER — Telehealth: Payer: Self-pay | Admitting: Surgery

## 2024-04-09 ENCOUNTER — Other Ambulatory Visit: Payer: Self-pay | Admitting: Surgery

## 2024-04-09 DIAGNOSIS — D0511 Intraductal carcinoma in situ of right breast: Secondary | ICD-10-CM

## 2024-04-09 DIAGNOSIS — C50919 Malignant neoplasm of unspecified site of unspecified female breast: Secondary | ICD-10-CM

## 2024-04-09 NOTE — Telephone Encounter (Signed)
 Patient has been advised of Pre-Admission date/time, and Surgery date at [facility]  Surgery Date: 04/21/24 Preadmission Testing Date: 04/13/24 (phone 1p-4p)  Patient informed of the scheduling process and surgery information given.    Patient scheduled for [facility] breast tag at [facility] 04/16/24.    Patient has been made aware to arrive at 7:30 am at [facility] as will be having SLN bx prior to her surgery.

## 2024-04-10 ENCOUNTER — Ambulatory Visit: Payer: Self-pay | Admitting: Surgery

## 2024-04-10 ENCOUNTER — Encounter: Payer: Self-pay | Admitting: *Deleted

## 2024-04-10 DIAGNOSIS — C50911 Malignant neoplasm of unspecified site of right female breast: Secondary | ICD-10-CM

## 2024-04-10 NOTE — Progress Notes (Signed)
 Lumpectomy is scheduled for 2/17.  She will see Dr. Jacobo and Dr. Lenn on 3/10.  Appt. Details given to her.

## 2024-04-10 NOTE — Addendum Note (Signed)
 Addended by: GEORGINA SHASTA POUR on: 04/10/2024 02:44 PM   Modules accepted: Orders

## 2024-04-13 ENCOUNTER — Inpatient Hospital Stay: Admission: RE | Admit: 2024-04-13 | Source: Ambulatory Visit

## 2024-04-15 ENCOUNTER — Inpatient Hospital Stay: Admitting: Occupational Therapy

## 2024-04-16 ENCOUNTER — Other Ambulatory Visit

## 2024-04-21 ENCOUNTER — Ambulatory Visit: Admit: 2024-04-21 | Admitting: Surgery

## 2024-04-21 ENCOUNTER — Encounter

## 2024-04-21 ENCOUNTER — Ambulatory Visit

## 2024-05-12 ENCOUNTER — Inpatient Hospital Stay: Admitting: Oncology

## 2024-05-12 ENCOUNTER — Ambulatory Visit: Admitting: Radiation Oncology
